# Patient Record
Sex: Male | Born: 1965 | Race: Black or African American | Hispanic: No | Marital: Married | Smoking: Former smoker
Health system: Southern US, Community
[De-identification: ages and names within clinical notes are randomized; demographics above are authoritative.]

## PROBLEM LIST (undated history)

## (undated) DIAGNOSIS — J45909 Unspecified asthma, uncomplicated: Secondary | ICD-10-CM

## (undated) DIAGNOSIS — E119 Type 2 diabetes mellitus without complications: Secondary | ICD-10-CM

## (undated) DIAGNOSIS — D649 Anemia, unspecified: Secondary | ICD-10-CM

## (undated) DIAGNOSIS — G4730 Sleep apnea, unspecified: Secondary | ICD-10-CM

## (undated) DIAGNOSIS — M1990 Unspecified osteoarthritis, unspecified site: Secondary | ICD-10-CM

## (undated) DIAGNOSIS — J449 Chronic obstructive pulmonary disease, unspecified: Secondary | ICD-10-CM

## (undated) DIAGNOSIS — I10 Essential (primary) hypertension: Secondary | ICD-10-CM

## (undated) DIAGNOSIS — E785 Hyperlipidemia, unspecified: Secondary | ICD-10-CM

## (undated) HISTORY — DX: Unspecified osteoarthritis, unspecified site: M19.90

## (undated) HISTORY — DX: Type 2 diabetes mellitus without complications: E11.9

## (undated) HISTORY — DX: Unspecified asthma, uncomplicated: J45.909

## (undated) HISTORY — DX: Chronic obstructive pulmonary disease, unspecified: J44.9

## (undated) HISTORY — DX: Sleep apnea, unspecified: G47.30

## (undated) HISTORY — DX: Essential (primary) hypertension: I10

## (undated) HISTORY — DX: Anemia, unspecified: D64.9

## (undated) HISTORY — DX: Hyperlipidemia, unspecified: E78.5

---

## 2017-09-06 ENCOUNTER — Ambulatory Visit: Payer: BLUE CROSS/BLUE SHIELD | Admitting: *Deleted

## 2017-09-08 ENCOUNTER — Encounter: Payer: BLUE CROSS/BLUE SHIELD | Attending: Endocrinology | Admitting: *Deleted

## 2017-09-08 ENCOUNTER — Encounter: Payer: Self-pay | Admitting: *Deleted

## 2017-09-08 VITALS — BP 142/86 | Ht 68.0 in | Wt 320.1 lb

## 2017-09-08 DIAGNOSIS — Z713 Dietary counseling and surveillance: Secondary | ICD-10-CM | POA: Diagnosis not present

## 2017-09-08 DIAGNOSIS — E119 Type 2 diabetes mellitus without complications: Secondary | ICD-10-CM | POA: Insufficient documentation

## 2017-09-08 NOTE — Patient Instructions (Signed)
Check blood sugars 3 x day before each meal and occasionally 2 hours after one meal Bring blood sugar records to the next class  Exercise: Begin walking for    10-15  minutes  3 days a week  Eat 3 meals day,  2 snacks a day Space meals 4-6 hours apart Limit desserts/sweets  Carry fast acting glucose and a snack at all times Rotate injection sites  Return for classes on:

## 2017-09-09 ENCOUNTER — Encounter: Payer: Self-pay | Admitting: *Deleted

## 2017-09-09 NOTE — Progress Notes (Signed)
Diabetes Self-Management Education  Visit Type: First/Initial  Appt. Start Time: 1545 Appt. End Time: 1700  09/08/2017  [name], identified by name and date of birth, is a 51 y.o. male with a diagnosis of Diabetes: Type 2.   ASSESSMENT  Blood pressure (!) 142/86, height  (1.727 m), weight (!) 320 lb 1.6 oz (145.2 kg). Body mass index is 48.67 kg/m.      Diabetes Self-Management Education - 09/08/17 1702      Visit Information   Visit Type First/Initial     Initial Visit   Diabetes Type Type 2   Are you currently following a meal plan? No   Are you taking your medications as prescribed? Yes   Date Diagnosed 5 years ago     Health Coping   How would you rate your overall health? Good     Psychosocial Assessment   Patient Belief/Attitude about Diabetes Other (comment)  "not sure"   Self-care barriers None   Self-management support Doctor's office;Family   Patient Concerns Nutrition/Meal planning;Glycemic Control;Weight Control;Medication;Monitoring;Healthy Lifestyle;Problem Solving   Special Needs None   Preferred Learning Style Auditory;Visual;Hands on   Learning Readiness Contemplating   How often do you need to have someone help you when you read instructions, pamphlets, or other written materials from your doctor or pharmacy? 1 - Never   What is the last grade level you completed in school? 2 years college     Pre-Education Assessment   Patient understands the diabetes disease and treatment process. Needs Instruction   Patient understands incorporating nutritional management into lifestyle. Needs Instruction   Patient undertands incorporating physical activity into lifestyle. Needs Instruction   Patient understands using medications safely. Needs Instruction   Patient understands monitoring blood glucose, interpreting and using results Needs Review   Patient understands prevention, detection, and treatment of acute complications. Needs Review   Patient  understands prevention, detection, and treatment of chronic complications. Needs Review   Patient understands how to develop strategies to address psychosocial issues. Needs Instruction   Patient understands how to develop strategies to promote health/change behavior. Needs Instruction     Complications   Last HgB A1C per patient/outside source 8.8 %  08/09/17   How often do you check your blood sugar? 3-4 times/day   Fasting Blood glucose range (mg/dL) 16-109  Pt reports FBG's 88-112 mg/dL.    Postprandial Blood glucose range (mg/dL) --  Pt reports pre-lunch readings 120-130's mg/dL and pre-supper 604-540'J mg/dL.    Have you had a dilated eye exam in the past 12 months? Yes   Have you had a dental exam in the past 12 months? Yes   Are you checking your feet? Yes   How many days per week are you checking your feet? 7     Dietary Intake   Breakfast oatmeal or cereal and milk   Snack (morning) granola bar or candy bar   Lunch left overs from supper   Snack (afternoon) chips or granola bar or cancy bar   Dinner meat (fish, beef, pork, chicken) with potatoes, corn, rice, salad, broccoli, cauliflower   Beverage(s) water, diet green tea, occasional Gatorade     Exercise   Exercise Type ADL's     Patient Education   Previous Diabetes Education No   Disease state  Definition of diabetes, type 1 and 2, and the diagnosis of diabetes   Nutrition management  Role of diet in the treatment of diabetes and the relationship between the three main  macronutrients and blood glucose level;Reviewed blood glucose goals for pre and post meals and how to evaluate the patients' food intake on their blood glucose level.   Physical activity and exercise  Role of exercise on diabetes management, blood pressure control and cardiac health.   Medications Taught/reviewed insulin injection, site rotation, insulin storage and needle disposal.;Reviewed patients medication for diabetes, action, purpose, timing of dose  and side effects.   Monitoring Purpose and frequency of SMBG.;Taught/discussed recording of test results and interpretation of SMBG.;Identified appropriate SMBG and/or A1C goals.   Acute complications Taught treatment of hypoglycemia - the 15 rule.   Chronic complications Relationship between chronic complications and blood glucose control   Psychosocial adjustment Identified and addressed patients feelings and concerns about diabetes   Personal strategies to promote health Review risk of smoking and offered smoking cessation     Individualized Goals (developed by patient)   Reducing Risk Improve blood sugars Decrease medications Prevent diabetes complications Lose weight Lead a healthier lifestyle Become more fit Quit smoking     Outcomes   Expected Outcomes Demonstrated interest in learning. Expect positive outcomes      Individualized Plan for Diabetes Self-Management Training:   Learning Objective:  Patient will have a greater understanding of diabetes self-management. Patient education plan is to attend individual and/or group sessions per assessed needs and concerns.   Plan:   Patient Instructions  Check blood sugars 3 x day before each meal and occasionally 2 hours after one meal Bring blood sugar records to the next class Exercise: Begin walking for    10-15  minutes  3 days a week Eat 3 meals day,  2 snacks a day Space meals 4-6 hours apart Limit desserts/sweets Carry fast acting glucose and a snack at all times Rotate injection sites   Expected Outcomes:  Demonstrated interest in learning. Expect positive outcomes  Education material provided:  General Meal Planning Guidelines Simple Meal Plan Symptoms, causes and treatments of Hypoglycemia  If problems or questions, patient to contact team via:  Sharion Settler, RN, CCM, CDE [phone]  Future DSME appointment:  September 12, 2017 for Diabetes Class 1

## 2017-09-12 ENCOUNTER — Encounter: Payer: Self-pay | Admitting: Dietician

## 2017-09-12 ENCOUNTER — Encounter: Payer: BLUE CROSS/BLUE SHIELD | Admitting: Dietician

## 2017-09-12 VITALS — Ht 68.0 in | Wt 317.4 lb

## 2017-09-12 DIAGNOSIS — E119 Type 2 diabetes mellitus without complications: Secondary | ICD-10-CM | POA: Diagnosis not present

## 2017-09-12 NOTE — Progress Notes (Signed)

## 2017-09-19 ENCOUNTER — Encounter: Payer: BLUE CROSS/BLUE SHIELD | Attending: Endocrinology

## 2017-09-19 VITALS — Wt 314.5 lb

## 2017-09-19 DIAGNOSIS — E119 Type 2 diabetes mellitus without complications: Secondary | ICD-10-CM | POA: Diagnosis present

## 2017-09-19 DIAGNOSIS — Z713 Dietary counseling and surveillance: Secondary | ICD-10-CM | POA: Diagnosis not present

## 2017-09-19 NOTE — Progress Notes (Signed)

## 2017-10-03 ENCOUNTER — Encounter: Payer: Self-pay | Admitting: Dietician

## 2017-10-03 ENCOUNTER — Encounter: Payer: BLUE CROSS/BLUE SHIELD | Admitting: Dietician

## 2017-10-03 VITALS — BP 134/78 | Ht 68.0 in | Wt 311.0 lb

## 2017-10-03 DIAGNOSIS — E119 Type 2 diabetes mellitus without complications: Secondary | ICD-10-CM

## 2017-10-03 NOTE — Progress Notes (Signed)

## 2017-10-04 ENCOUNTER — Encounter: Payer: Self-pay | Admitting: *Deleted

## 2018-05-29 ENCOUNTER — Ambulatory Visit: Payer: BLUE CROSS/BLUE SHIELD | Admitting: *Deleted

## 2018-06-08 ENCOUNTER — Encounter: Payer: BLUE CROSS/BLUE SHIELD | Attending: Endocrinology | Admitting: *Deleted

## 2018-06-08 ENCOUNTER — Encounter: Payer: Self-pay | Admitting: *Deleted

## 2018-06-08 VITALS — BP 130/78 | Ht 67.0 in | Wt 317.1 lb

## 2018-06-08 DIAGNOSIS — Z6841 Body Mass Index (BMI) 40.0 and over, adult: Secondary | ICD-10-CM | POA: Diagnosis not present

## 2018-06-08 DIAGNOSIS — Z713 Dietary counseling and surveillance: Secondary | ICD-10-CM | POA: Diagnosis not present

## 2018-06-08 DIAGNOSIS — E119 Type 2 diabetes mellitus without complications: Secondary | ICD-10-CM

## 2018-06-08 NOTE — Patient Instructions (Addendum)
Check blood sugars 4 x day before each meal and before bed every day Bring blood sugar records to the next appointment  Exercise: Begin walking  for  10-15  minutes   3  days a week and gradually increase to 30 minutes 5 x week  Eat 3 meals day, 2-3 snacks a day Space meals 4-6 hours apart Allow 2-3 hours between meals and snacks Limit desserts/sweets  Complete 3 Day Food Record and bring to next appt  Quit  smoking  Carry fast acting glucose and a snack at all times Rotate injection sites  Return for appointment on: Monday July 10, 2018 at 4:00 pm with Jill Sideolleen (dietitian)

## 2018-06-08 NOTE — Progress Notes (Signed)
Diabetes Self-Management Education  Visit Type: First/Initial  Appt. Start Time: 1535 Appt. End Time: 1645  06/08/2018  [name], identified by name and date of birth, is a 52 y.o. male with a diagnosis of Diabetes: Type 2.   ASSESSMENT  Blood pressure 130/78, height 5\' 7"  (1.702 m), weight (!) 317 lb 1.6 oz (143.8 kg). Body mass index is 49.66 kg/m.  Diabetes Self-Management Education - 06/08/18 1737      Visit Information   Visit Type  First/Initial      Initial Visit   Diabetes Type  Type 2    Are you currently following a meal plan?  No    Are you taking your medications as prescribed?  Yes    Date Diagnosed  8 years ago      Health Coping   How would you rate your overall health?  Fair      Psychosocial Assessment   Patient Belief/Attitude about Diabetes  Other (comment) "need to be more cautious"    Self-care barriers  None    Self-management support  Doctor's office;Family    Patient Concerns  Nutrition/Meal planning;Medication;Monitoring;Healthy Lifestyle;Problem Solving;Glycemic Control;Weight Control    Special Needs  None    Preferred Learning Style  Auditory;Visual;Hands on    Learning Readiness  Contemplating    How often do you need to have someone help you when you read instructions, pamphlets, or other written materials from your doctor or pharmacy?  1 - Never    What is the last grade level you completed in school?  2 years college      Pre-Education Assessment   Patient understands the diabetes disease and treatment process.  Needs Review    Patient understands incorporating nutritional management into lifestyle.  Needs Review    Patient undertands incorporating physical activity into lifestyle.  Needs Instruction    Patient understands using medications safely.  Needs Review    Patient understands monitoring blood glucose, interpreting and using results  Needs Review    Patient understands prevention, detection, and treatment of acute  complications.  Needs Review    Patient understands prevention, detection, and treatment of chronic complications.  Needs Review    Patient understands how to develop strategies to address psychosocial issues.  Needs Review    Patient understands how to develop strategies to promote health/change behavior.  Needs Review      Complications   Last HgB A1C per patient/outside source  6.9 % 05/04/18    How often do you check your blood sugar?  3-4 times/day    Fasting Blood glucose range (mg/dL)  [id number] Pt reports FBG's 110-120's mg/dL; pre-lunch 29-5685-92 mg/dL; bedtime 213-086'V150-180's mg/dL    Have you had a dilated eye exam in the past 12 months?  Yes    Have you had a dental exam in the past 12 months?  Yes    Are you checking your feet?  Yes    How many days per week are you checking your feet?  7      Dietary Intake   Breakfast  Greek yogurt and granola bar; peanut butter and jelly sandwich; peanuts or trail mix    Snack (morning)  peanuts, candy bar    Lunch  left overs from supper    Snack (afternoon)  popcorn    Dinner  chicken, beef, pork, fish - potatoes, peas, beans, corn, rice, green beans, broccoli, cauliflower, greens    Snack (evening)  sweets    Beverage(s)  water,  diet green tea      Exercise   Exercise Type  ADL's      Patient Education   Previous Diabetes Education  Yes (please comment) Completed Diabetes classes in 2018    Disease state   Explored patient's options for treatment of their diabetes    Nutrition management   Role of diet in the treatment of diabetes and the relationship between the three main macronutrients and blood glucose level;Food label reading, portion sizes and measuring food.;Reviewed blood glucose goals for pre and post meals and how to evaluate the patients' food intake on their blood glucose level.    Physical activity and exercise   Role of exercise on diabetes management, blood pressure control and cardiac health.    Medications  Taught/reviewed insulin  injection, site rotation, insulin storage and needle disposal.;Reviewed patients medication for diabetes, action, purpose, timing of dose and side effects.    Monitoring  Purpose and frequency of SMBG.;Taught/discussed recording of test results and interpretation of SMBG.;Identified appropriate SMBG and/or A1C goals.    Acute complications  Taught treatment of hypoglycemia - the 15 rule.    Chronic complications  Relationship between chronic complications and blood glucose control    Psychosocial adjustment  Identified and addressed patients feelings and concerns about diabetes      Individualized Goals (developed by patient)   Reducing Risk  Improve blood sugars Decrease medications Prevent diabetes complications Lose weight Lead a healthier lifestyle Become more fit Quit smoking     Outcomes   Expected Outcomes  Demonstrated interest in learning. Expect positive outcomes    Future DMSE  4-6 wks       Individualized Plan for Diabetes Self-Management Training:   Learning Objective:  Patient will have a greater understanding of diabetes self-management. Patient education plan is to attend individual and/or group sessions per assessed needs and concerns.   Plan:   Patient Instructions  Check blood sugars 4 x day before each meal and before bed every day Bring blood sugar records to the next appointment Exercise: Begin walking  for  10-15  minutes   3  days a week and gradually increase to 30 minutes 5 x week Eat 3 meals day, 2-3 snacks a day Space meals 4-6 hours apart Allow 2-3 hours between meals and snacks Limit desserts/sweets Complete 3 Day Food Record and bring to next appt Quit  smoking Carry fast acting glucose and a snack at all times Rotate injection sites Return for appointment on: Monday July 10, 2018 at 4:00 pm with Jill Side (dietitian)  Expected Outcomes:  Demonstrated interest in learning. Expect positive outcomes  Education material provided:  General Meal  Planning Guidelines Simple Meal Plan 3 Day Food Record Glucose tablets Symptoms, causes and treatments of Hypoglycemia  If problems or questions, patient to contact team via:  Logan Settler, RN, CCM, CDE [phone]  Future DSME appointment: 4-6 wks  July 10, 2018 with the dietitian

## 2018-07-10 ENCOUNTER — Encounter: Payer: Self-pay | Admitting: Dietician

## 2018-07-10 ENCOUNTER — Encounter: Payer: BLUE CROSS/BLUE SHIELD | Attending: Endocrinology | Admitting: Dietician

## 2018-07-10 VITALS — Wt 317.7 lb

## 2018-07-10 DIAGNOSIS — Z713 Dietary counseling and surveillance: Secondary | ICD-10-CM | POA: Insufficient documentation

## 2018-07-10 DIAGNOSIS — Z6841 Body Mass Index (BMI) 40.0 and over, adult: Secondary | ICD-10-CM | POA: Diagnosis not present

## 2018-07-10 DIAGNOSIS — E119 Type 2 diabetes mellitus without complications: Secondary | ICD-10-CM

## 2018-07-10 NOTE — Progress Notes (Signed)
Diabetes Self-Management Education  Visit Type:  Follow-up  Appt. Start Time: 1600 Appt. End Time: 1700  07/10/2018  [name], identified by name and date of birth, is a 52 y.o. male with a diagnosis of Diabetes:Type 2 DM  ASSESSMENT  Weight (!) 317 lb 11.2 oz (144.1 kg). Body mass index is 49.76 kg/m.   Diabetes Self-Management Education - 85/63/14 9702      Complications   Last HgB A1C per patient/outside source  6.5 %    How often do you check your blood sugar?  3-4 times/day    Fasting Blood glucose range (mg/dL)  70-129;130-179    Have you had a dilated eye exam in the past 12 months?  Yes    Have you had a dental exam in the past 12 months?  Yes    Are you checking your feet?  Yes    How many days per week are you checking your feet?  7      Dietary Intake   Breakfast  7-8:00am- 2 eggs with cheese, 2 slices white toast, diet green tea or sometimes 2 candy bars out of vending machine    Snack (morning)  diet mountain dew, Kit Kat candy bar    Lunch  6"in tuna sub, 1 oatmeal raisin cookie, sweet tea or 3 pieces fried chicken, fries, large sweet tea ([employer]) or leftovers    Snack (afternoon)  1 large bag of popcorn    Dinner  taco salad, diet green tea or chicken pie, collard greens, baked beans, diet green tea    Beverage(s)  diet green tea, water, sweet tea ( 3 x per week)      Exercise   Exercise Type  Light (walking / raking leaves) stretches; weights 2    How many days per week to you exercise?  2    How many minutes per day do you exercise?  30    Total minutes per week of exercise  60      Patient Education   Nutrition management   Role of diet in the treatment of diabetes and the relationship between the three main macronutrients and blood glucose level;Carbohydrate counting;Information on hints to eating out and maintain blood glucose control.;Food label reading, portion sizes and measuring food.;Effects of alcohol on blood glucose and safety factors  with consumption of alcohol.;Meal options for control of blood glucose level and chronic complications.    Physical activity and exercise   Role of exercise on diabetes management, blood pressure control and cardiac health.;Helped patient identify appropriate exercises in relation to his/her diabetes, diabetes complications and other health issue.      Post-Education Assessment   Patient understands the diabetes disease and treatment process.  Demonstrates understanding / competency    Patient understands incorporating nutritional management into lifestyle.  Demonstrates understanding / competency    Patient undertands incorporating physical activity into lifestyle.  Demonstrates understanding / competency    Patient understands using medications safely.  Demonstrates understanding / competency    Patient understands monitoring blood glucose, interpreting and using results  Demonstrates understanding / competency    Patient understands prevention, detection, and treatment of acute complications.  Demonstrates understanding / competency    Patient understands how to develop strategies to promote health/change behavior.  Demonstrates understanding / competency      Outcomes   Program Status  Completed       Learning Objective:  Patient will have a greater understanding of diabetes self-management. Patient education plan  is to attend individual and/or group sessions per assessed needs and concerns.  Education: Instructed patient on a meal plan based on 1800-2000 calories including carbohydrate counting, portion control and how to better balance carbohydrate, protein and non-starchy vegetables. Use food guide plate, planning a balanced meal hand-out and food models to instruct. Instructed on reading labels for carbohydrate content. Reviewed his food records with him making suggestions on ways to adjust as well as commending on positive changes made.      Patient Instructions  Balance meals  with 2-4 oz lean protein, 3-4 servings of carbohydrate (starch, fruit, milk/yogurt) and free vegetables. Switch to all unsweetened beverages or begin with asking for half sweetened tea, half unsweetened and continue with increasing water intake. Include as many non-starchy vegetables as possible. Read labels for carbohydrate gms. Continue to work with portion control.  Limit dinner to 1 plate with exception of eating any non-starchy vegetables. Try graham crackers and peanut butter to help when wanting something sweet rather than candy bars. Begin to work out at gym at apartment complex.    Expected Outcomes:  Demonstrated interest in learning. Expect positive outcomes  Education material provided:  Food guide plate Planning a Balanced Meal Balanced meal menus  If problems or questions, patient to contact team via: Karolee Stamps, [place], [place]  Future DSME appointment: - PRN

## 2018-07-10 NOTE — Patient Instructions (Addendum)
Balance meals with 2-4 oz lean protein, 3-4 servings of carbohydrate (starch, fruit, milk/yogurt) and free vegetables. Switch to all unsweetened beverages or begin with asking for half sweetened tea, half unsweetened and continue with increasing water intake. Include as many non-starchy vegetables as possible. Read labels for carbohydrate gms. Continue to work with portion control.  Limit dinner to 1 plate with exception of eating any non-starchy vegetables. Try graham crackers and peanut butter to help when wanting something sweet rather than candy bars. Begin to work out at gym at apartment complex.

## 2019-05-18 ENCOUNTER — Ambulatory Visit: Payer: Self-pay | Admitting: Urology

## 2019-05-22 ENCOUNTER — Encounter: Payer: Self-pay | Admitting: Urology

## 2019-05-22 ENCOUNTER — Ambulatory Visit: Payer: BC Managed Care – PPO | Admitting: Urology

## 2019-05-22 ENCOUNTER — Other Ambulatory Visit: Payer: Self-pay

## 2019-05-22 VITALS — BP 158/79 | HR 101 | Ht 68.0 in | Wt 302.6 lb

## 2019-05-22 DIAGNOSIS — E291 Testicular hypofunction: Secondary | ICD-10-CM | POA: Diagnosis not present

## 2019-05-22 DIAGNOSIS — B356 Tinea cruris: Secondary | ICD-10-CM | POA: Diagnosis not present

## 2019-05-22 NOTE — Progress Notes (Signed)
05/22/2019 2:23 PM   Logan Harrell 1966-07-19 [id number]  Referring provider: Alan Mulder, MD [address] [place], [place] [address]  Chief Complaint  Patient presents with  . Hypogonadism    HPI: 53 year old male seen in consultation at request of Dr. Patrecia Pace for evaluation of hypogonadism.  He states he was started on TRT approximately 1 year ago when his testosterone levels were low.  He states he was not having symptoms of low testosterone however when questioned further does complain of tiredness, fatigue and decreased libido.  He also has erectile dysfunction and is on sildenafil.  He was started on testosterone IM injections 300 mg every 3 weeks.  A testosterone level performed on 03/10/2019 was <100 and he was recommended he stop injections and was referred here.  The timing of the blood draw in relation to his last injection is unknown.  On review of available labs I did not see an LH or prolactin.  He has been off replacement approximately 2 months.  He is also complaining of a rash in the groin area bilaterally and has only used Neosporin.  PSA was 0.5 on 03/10/2019  PMH: Past Medical History:  Diagnosis Date  . Arthritis   . Diabetes mellitus without complication (HCC)   . Hyperlipidemia   . Hypertension   . Sleep apnea     Surgical History: No past surgical history on file.  Home Medications:  Allergies as of 05/22/2019   No Known Allergies     Medication List       Accurate as of May 22, 2019  2:23 PM. If you have any questions, ask your nurse or doctor.        STOP taking these medications   GINSENG PO Stopped by:  Riki Altes, MD   KRILL OIL PO Stopped by:  Riki Altes, MD     TAKE these medications   aspirin EC 81 MG tablet Take 81 mg by mouth daily.   Contour Next Test test strip Generic drug:  glucose blood   Jardiance 25 MG Tabs tablet Generic drug:  empagliflozin Take 25 mg by mouth daily.   Kombiglyze XR 2.04-999  MG Tb24 Generic drug:  Saxagliptin-Metformin Take 1 tablet by mouth 2 (two) times daily.   lisinopril 20 MG tablet Commonly known as:  ZESTRIL Take 20 mg by mouth daily.   Microlet Lancets Misc   multivitamin tablet Take 1 tablet by mouth daily.   Ozempic (0.25 or 0.5 MG/DOSE) 2 MG/1.5ML Sopn Generic drug:  Semaglutide(0.25 or 0.5MG /DOS) Inject 1 mg into the skin once a week.   rosuvastatin 10 MG tablet Commonly known as:  CRESTOR Take 10 mg by mouth daily.   sildenafil 50 MG tablet Commonly known as:  VIAGRA   Toujeo SoloStar 300 UNIT/ML Sopn Generic drug:  Insulin Glargine (1 Unit Dial) What changed:  Another medication with the same name was removed. Continue taking this medication, and follow the directions you see here. Changed by:  Riki Altes, MD   vitamin C 500 MG tablet Commonly known as:  ASCORBIC ACID Take 500 mg by mouth daily.   VITAMIN D (CHOLECALCIFEROL) PO Take 1-2 capsules by mouth daily.       Allergies: No Known Allergies  Family History: Family History  Problem Relation Age of Onset  . Diabetes Mother   . Diabetes Father     Social History:  reports that he has been smoking cigarettes. He has a 35.00 pack-year smoking history.  He has never used smokeless tobacco. He reports current alcohol use of about 4.0 standard drinks of alcohol per week. He reports that he does not use drugs.  ROS: UROLOGY Frequent Urination?: Yes Hard to postpone urination?: No Burning/pain with urination?: No Get up at night to urinate?: Yes Leakage of urine?: Yes Urine stream starts and stops?: No Trouble starting stream?: No Do you have to strain to urinate?: No Blood in urine?: No Urinary tract infection?: Yes Sexually transmitted disease?: No Injury to kidneys or bladder?: No Painful intercourse?: No Weak stream?: No Erection problems?: Yes Penile pain?: No  Gastrointestinal Nausea?: No Vomiting?: No Indigestion/heartburn?: No Diarrhea?: No  Constipation?: No  Constitutional Fever: No Night sweats?: Yes Weight loss?: No Fatigue?: No  Skin Skin rash/lesions?: Yes Itching?: Yes  Eyes Blurred vision?: No Double vision?: No  Ears/Nose/Throat Sore throat?: No Sinus problems?: No  Hematologic/Lymphatic Swollen glands?: No Easy bruising?: No  Cardiovascular Leg swelling?: No Chest pain?: No  Respiratory Cough?: No Shortness of breath?: No  Endocrine Excessive thirst?: No  Musculoskeletal Back pain?: Yes Joint pain?: Yes  Neurological Headaches?: No Dizziness?: No  Psychologic Depression?: No Anxiety?: No  Physical Exam: BP (!) 158/79 (BP Location: Left Arm, Patient Position: Sitting, Cuff Size: Normal)   Pulse (!) 101   Ht 5\' 8"  (1.727 m)   Wt (!) 302 lb 9.6 oz (137.3 kg)   BMI 46.01 kg/m   Constitutional:  Alert and oriented, No acute distress. HEENT: [place] AT, moist mucus membranes.  Trachea midline, no masses. Cardiovascular: No clubbing, cyanosis, or edema. Respiratory: Normal respiratory effort, no increased work of breathing. GI: Abdomen is soft, nontender, nondistended, no abdominal masses GU: No CVA tenderness.  Penis without lesions, testes descended bilateral without masses or tenderness, cord/epididymis palpably normal bilaterally.  Mild erythema groin creases bilaterally consistent with tinea cruris Lymph: No cervical or inguinal lymphadenopathy. Skin: No rashes, bruises or suspicious lesions. Neurologic: Grossly intact, no focal deficits, moving all 4 extremities. Psychiatric: Normal mood and affect.   Assessment & Plan:   53 year old male with hypogonadism with symptoms of tiredness, fatigue and decreased libido.  He has been off TRT for least 2 months. Repeat T level drawn today as well as LH and prolactin.  Replacement options were reviewed including topicals, resuming IM injections either weekly or every 2 weeks and Xyosted.  He was interested in [place]osted if his insurance will  cover.  Potential side effects of testosterone replacement were discussed including stimulation of benign prostatic growth with lower urinary tract symptoms; erythrocytosis; edema; gynecomastia; worsening sleep apnea; venous thromboembolism; testicular atrophy and infertility. Recent studies suggesting an increased incidence of heart attack and stroke in patients taking testosterone was discussed. He was informed there is conflicting evidence regarding the impact of testosterone therapy on cardiovascular risk. The theoretical risk of growth stimulation of an undetected prostate cancer was also discussed.  He was informed that current evidence does not provide any definitive answers regarding the risks of testosterone therapy on prostate cancer and cardiovascular disease. The need for periodic monitoring of his testosterone level, PSA, hematocrit and DRE was discussed.  Tolnaftate recommended for his tinea cruris.   Riki AltesScott C Stoioff, MD  [facility] [address], [address] [place], [place] [id number] [phone]

## 2019-05-22 NOTE — Patient Instructions (Signed)
Erectile Dysfunction  Erectile dysfunction (ED) is the inability to get or keep an erection in order to have sexual intercourse. Erectile dysfunction may include:   Inability to get an erection.   Lack of enough hardness of the erection to allow penetration.   Loss of the erection before sex is finished.  What are the causes?  This condition may be caused by:   Certain medicines, such as:  ? Pain relievers.  ? Antihistamines.  ? Antidepressants.  ? Blood pressure medicines.  ? Water pills (diuretics).  ? Ulcer medicines.  ? Muscle relaxants.  ? Drugs.   Excessive drinking.   Psychological causes, such as:  ? Anxiety.  ? Depression.  ? Sadness.  ? Exhaustion.  ? Performance fear.  ? Stress.   Physical causes, such as:  ? Artery problems. This may include diabetes, smoking, liver disease, or atherosclerosis.  ? High blood pressure.  ? Hormonal problems, such as low testosterone.  ? Obesity.  ? Nerve problems. This may include back or pelvic injuries, diabetes mellitus, multiple sclerosis, or Parkinson disease.  What are the signs or symptoms?  Symptoms of this condition include:   Inability to get an erection.   Lack of enough hardness of the erection to allow penetration.   Loss of the erection before sex is finished.   Normal erections at some times, but with frequent unsatisfactory episodes.   Low sexual satisfaction in either partner due to erection problems.   A curved penis occurring with erection. The curve may cause pain or the penis may be too curved to allow for intercourse.   Never having nighttime erections.  How is this diagnosed?  This condition is often diagnosed by:   Performing a physical exam to find other diseases or specific problems with the penis.   Asking you detailed questions about the problem.   Performing blood tests to check for diabetes mellitus or to measure hormone levels.   Performing other tests to check for underlying health conditions.   Performing an ultrasound  exam to check for scarring.   Performing a test to check blood flow to the penis.   Doing a sleep study at home to measure nighttime erections.  How is this treated?  This condition may be treated by:   Medicine taken by mouth to help you achieve an erection (oral medicine).   Hormone replacement therapy to replace low testosterone levels.   Medicine that is injected into the penis. Your health care provider may instruct you how to give yourself these injections at home.   Vacuum pump. This is a pump with a ring on it. The pump and ring are placed on the penis and used to create pressure that helps the penis become erect.   Penile implant surgery. In this procedure, you may receive:  ? An inflatable implant. This consists of cylinders, a pump, and a reservoir. The cylinders can be inflated with a fluid that helps to create an erection, and they can be deflated after intercourse.  ? A semi-rigid implant. This consists of two silicone rubber rods. The rods provide some rigidity. They are also flexible, so the penis can both curve downward in its normal position and become straight for sexual intercourse.   Blood vessel surgery, to improve blood flow to the penis. During this procedure, a blood vessel from a different part of the body is placed into the penis to allow blood to flow around (bypass) damaged or blocked blood vessels.     Lifestyle changes, such as exercising more, losing weight, and quitting smoking.  Follow these instructions at home:  Medicines     Take over-the-counter and prescription medicines only as told by your health care provider. Do not increase the dosage without first discussing it with your health care provider.   If you are using self-injections, perform injections as directed by your health care provider. Make sure to avoid any veins that are on the surface of the penis. After giving an injection, apply pressure to the injection site for 5 minutes.  General  instructions   Exercise regularly, as directed by your health care provider. Work with your health care provider to lose weight, if needed.   Do not use any products that contain nicotine or tobacco, such as cigarettes and e-cigarettes. If you need help quitting, ask your health care provider.   Before using a vacuum pump, read the instructions that come with the pump and discuss any questions with your health care provider.   Keep all follow-up visits as told by your health care provider. This is important.  Contact a health care provider if:   You feel nauseous.   You vomit.  Get help right away if:   You are taking oral or injectable medicines and you have an erection that lasts longer than 4 hours. If your health care provider is unavailable, go to the nearest emergency room for evaluation. An erection that lasts much longer than 4 hours can result in permanent damage to your penis.   You have severe pain in your groin or abdomen.   You develop redness or severe swelling of your penis.   You have redness spreading up into your groin or lower abdomen.   You are unable to urinate.   You experience chest pain or a rapid heart beat (palpitations) after taking oral medicines.  Summary   Erectile dysfunction (ED) is the inability to get or keep an erection during sexual intercourse. This problem can usually be treated successfully.   This condition is diagnosed based on a physical exam, your symptoms, and tests to determine the cause. Treatment varies depending on the cause, and may include medicines, hormone therapy, surgery, or vacuum pump.   You may need follow-up visits to make sure that you are using your medicines or devices correctly.   Get help right away if you are taking or injecting medicines and you have an erection that lasts longer than 4 hours.  This information is not intended to replace advice given to you by your health care provider. Make sure you discuss any questions you have with  your health care provider.  Document Released: 12/03/2000 Document Revised: 12/22/2016 Document Reviewed: 12/22/2016  Elsevier Interactive Patient Education  2019 [employer]

## 2019-05-23 ENCOUNTER — Telehealth: Payer: Self-pay

## 2019-05-23 DIAGNOSIS — E291 Testicular hypofunction: Secondary | ICD-10-CM

## 2019-05-23 LAB — PROLACTIN: Prolactin: 7.2 ng/mL (ref 4.0–15.2)

## 2019-05-23 LAB — TESTOSTERONE: Testosterone: 144 ng/dL — ABNORMAL LOW (ref 264–916)

## 2019-05-23 LAB — LUTEINIZING HORMONE: LH: 16.7 m[IU]/mL — ABNORMAL HIGH (ref 1.7–8.6)

## 2019-05-23 NOTE — Telephone Encounter (Signed)
Patient notified he is checking with his insurance company to see which medication will be covered and will call back to notify us which one to send in

## 2019-05-23 NOTE — Telephone Encounter (Signed)
-----   Message from Riki Altes, MD sent at 05/23/2019 12:30 PM EDT ----- Testosterone level low at 144.  LH and prolactin showed no evidence of the pituitary abnormalities.  Okay to restart TRT.  I discussed topicals, resuming intramuscular injections and subcutaneous testosterone (Xyosted).  He was going to think over these options.  Does he have a preference?

## 2019-05-23 NOTE — Telephone Encounter (Signed)
Patient called the office back and stated that his insurance will cover the Self injection generic brand testosterone.

## 2019-05-24 ENCOUNTER — Encounter: Payer: Self-pay | Admitting: Urology

## 2019-05-24 MED ORDER — TESTOSTERONE ENANTHATE 75 MG/0.5ML ~~LOC~~ SOAJ: 75.0000 ug | SUBCUTANEOUS | 2 refills | Status: DC

## 2019-05-24 NOTE — Addendum Note (Signed)
Addended by: Riki Altes on: 05/24/2019 07:26 AM   Modules accepted: Orders

## 2019-05-24 NOTE — Telephone Encounter (Signed)
LMOM to notify patient RX was sent to pharmacy and they will get PA and send Medication to home.

## 2019-05-24 NOTE — Telephone Encounter (Addendum)
Rx [employer] sent to [facility] pharmacy  Please schedule follow-up appointment 6-8 weeks with testosterone level prior

## 2020-11-19 ENCOUNTER — Emergency Department: Payer: BC Managed Care – PPO

## 2020-11-19 ENCOUNTER — Emergency Department
Admission: EM | Admit: 2020-11-19 | Discharge: 2020-11-19 | Disposition: A | Discharge status: Home / Self Care | Payer: BC Managed Care – PPO | Attending: Emergency Medicine | Admitting: Emergency Medicine

## 2020-11-19 ENCOUNTER — Encounter: Payer: Self-pay | Admitting: *Deleted

## 2020-11-19 ENCOUNTER — Other Ambulatory Visit: Payer: Self-pay

## 2020-11-19 DIAGNOSIS — M79652 Pain in left thigh: Secondary | ICD-10-CM | POA: Insufficient documentation

## 2020-11-19 DIAGNOSIS — M545 Low back pain, unspecified: Secondary | ICD-10-CM | POA: Insufficient documentation

## 2020-11-19 DIAGNOSIS — E119 Type 2 diabetes mellitus without complications: Secondary | ICD-10-CM | POA: Insufficient documentation

## 2020-11-19 DIAGNOSIS — Z79899 Other long term (current) drug therapy: Secondary | ICD-10-CM | POA: Insufficient documentation

## 2020-11-19 DIAGNOSIS — F17210 Nicotine dependence, cigarettes, uncomplicated: Secondary | ICD-10-CM | POA: Insufficient documentation

## 2020-11-19 DIAGNOSIS — I10 Essential (primary) hypertension: Secondary | ICD-10-CM | POA: Diagnosis not present

## 2020-11-19 DIAGNOSIS — M542 Cervicalgia: Secondary | ICD-10-CM | POA: Insufficient documentation

## 2020-11-19 DIAGNOSIS — Z7982 Long term (current) use of aspirin: Secondary | ICD-10-CM | POA: Diagnosis not present

## 2020-11-19 DIAGNOSIS — Y92410 Unspecified street and highway as the place of occurrence of the external cause: Secondary | ICD-10-CM | POA: Diagnosis not present

## 2020-11-19 MED ORDER — METHOCARBAMOL 750 MG PO TABS: 750.0000 mg | ORAL_TABLET | Freq: Four times a day (QID) | ORAL | 0 refills | Status: AC | PRN

## 2020-11-19 MED ORDER — MELOXICAM 15 MG PO TABS: 15.0000 mg | ORAL_TABLET | Freq: Every day | ORAL | 0 refills | Status: AC

## 2020-11-19 MED ORDER — METHOCARBAMOL 500 MG PO TABS
750.0000 mg | ORAL_TABLET | Freq: Once | ORAL | Status: AC
  Administered 2020-11-19: 750 mg via ORAL
  Filled 2020-11-19: qty 2

## 2020-11-19 MED ORDER — KETOROLAC TROMETHAMINE 60 MG/2ML IM SOLN
30.0000 mg | Freq: Once | INTRAMUSCULAR | Status: AC
  Administered 2020-11-19: 30 mg via INTRAMUSCULAR
  Filled 2020-11-19: qty 2
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
## 2023-08-08 ENCOUNTER — Other Ambulatory Visit: Payer: Self-pay

## 2023-08-08 ENCOUNTER — Emergency Department: Payer: BC Managed Care – PPO

## 2023-08-08 ENCOUNTER — Inpatient Hospital Stay
Admission: EM | Admit: 2023-08-08 | Discharge: 2023-08-15 | DRG: 643 | Disposition: A | Discharge status: Home / Self Care | Payer: BC Managed Care – PPO | Attending: Internal Medicine | Admitting: Internal Medicine

## 2023-08-08 DIAGNOSIS — R7989 Other specified abnormal findings of blood chemistry: Secondary | ICD-10-CM | POA: Diagnosis present

## 2023-08-08 DIAGNOSIS — Z794 Long term (current) use of insulin: Secondary | ICD-10-CM

## 2023-08-08 DIAGNOSIS — R059 Cough, unspecified: Secondary | ICD-10-CM | POA: Diagnosis not present

## 2023-08-08 DIAGNOSIS — I10 Essential (primary) hypertension: Secondary | ICD-10-CM | POA: Insufficient documentation

## 2023-08-08 DIAGNOSIS — K8020 Calculus of gallbladder without cholecystitis without obstruction: Secondary | ICD-10-CM | POA: Diagnosis present

## 2023-08-08 DIAGNOSIS — I2489 Other forms of acute ischemic heart disease: Secondary | ICD-10-CM | POA: Diagnosis present

## 2023-08-08 DIAGNOSIS — G4733 Obstructive sleep apnea (adult) (pediatric): Secondary | ICD-10-CM | POA: Diagnosis present

## 2023-08-08 DIAGNOSIS — E2609 Other primary hyperaldosteronism: Secondary | ICD-10-CM | POA: Insufficient documentation

## 2023-08-08 DIAGNOSIS — I509 Heart failure, unspecified: Secondary | ICD-10-CM | POA: Diagnosis present

## 2023-08-08 DIAGNOSIS — Z789 Other specified health status: Secondary | ICD-10-CM

## 2023-08-08 DIAGNOSIS — I110 Hypertensive heart disease with heart failure: Secondary | ICD-10-CM | POA: Diagnosis present

## 2023-08-08 DIAGNOSIS — E785 Hyperlipidemia, unspecified: Secondary | ICD-10-CM | POA: Diagnosis present

## 2023-08-08 DIAGNOSIS — Z7982 Long term (current) use of aspirin: Secondary | ICD-10-CM

## 2023-08-08 DIAGNOSIS — Z79899 Other long term (current) drug therapy: Secondary | ICD-10-CM

## 2023-08-08 DIAGNOSIS — F17210 Nicotine dependence, cigarettes, uncomplicated: Secondary | ICD-10-CM | POA: Diagnosis present

## 2023-08-08 DIAGNOSIS — Z833 Family history of diabetes mellitus: Secondary | ICD-10-CM

## 2023-08-08 DIAGNOSIS — E2601 Conn's syndrome: Principal | ICD-10-CM | POA: Diagnosis present

## 2023-08-08 DIAGNOSIS — E1165 Type 2 diabetes mellitus with hyperglycemia: Secondary | ICD-10-CM | POA: Diagnosis present

## 2023-08-08 DIAGNOSIS — I1A0 Resistant hypertension: Secondary | ICD-10-CM | POA: Diagnosis present

## 2023-08-08 DIAGNOSIS — E669 Obesity, unspecified: Secondary | ICD-10-CM | POA: Insufficient documentation

## 2023-08-08 DIAGNOSIS — Z7985 Long-term (current) use of injectable non-insulin antidiabetic drugs: Secondary | ICD-10-CM

## 2023-08-08 DIAGNOSIS — A4189 Other specified sepsis: Secondary | ICD-10-CM

## 2023-08-08 DIAGNOSIS — R718 Other abnormality of red blood cells: Secondary | ICD-10-CM | POA: Diagnosis present

## 2023-08-08 DIAGNOSIS — M7989 Other specified soft tissue disorders: Secondary | ICD-10-CM | POA: Diagnosis present

## 2023-08-08 DIAGNOSIS — Z8639 Personal history of other endocrine, nutritional and metabolic disease: Secondary | ICD-10-CM

## 2023-08-08 DIAGNOSIS — Z7984 Long term (current) use of oral hypoglycemic drugs: Secondary | ICD-10-CM

## 2023-08-08 DIAGNOSIS — K760 Fatty (change of) liver, not elsewhere classified: Secondary | ICD-10-CM | POA: Diagnosis present

## 2023-08-08 DIAGNOSIS — E876 Hypokalemia: Secondary | ICD-10-CM | POA: Diagnosis present

## 2023-08-08 DIAGNOSIS — Z8679 Personal history of other diseases of the circulatory system: Secondary | ICD-10-CM

## 2023-08-08 DIAGNOSIS — Z23 Encounter for immunization: Secondary | ICD-10-CM

## 2023-08-08 DIAGNOSIS — R600 Localized edema: Secondary | ICD-10-CM | POA: Diagnosis not present

## 2023-08-08 DIAGNOSIS — I160 Hypertensive urgency: Secondary | ICD-10-CM | POA: Diagnosis present

## 2023-08-08 DIAGNOSIS — Z79890 Hormone replacement therapy (postmenopausal): Secondary | ICD-10-CM

## 2023-08-08 DIAGNOSIS — Z6841 Body Mass Index (BMI) 40.0 and over, adult: Secondary | ICD-10-CM

## 2023-08-08 DIAGNOSIS — U071 COVID-19: Secondary | ICD-10-CM

## 2023-08-08 LAB — ACETAMINOPHEN LEVEL: Acetaminophen (Tylenol), Serum: <10 ug/mL — ABNORMAL LOW (ref 10–30)

## 2023-08-08 LAB — CBC
HCT: 37.3 % — ABNORMAL LOW (ref 39.0–52.0)
Hemoglobin: 12.3 g/dL — ABNORMAL LOW (ref 13.0–17.0)
MCH: 23.2 pg — ABNORMAL LOW (ref 26.0–34.0)
MCHC: 33 g/dL (ref 30.0–36.0)
MCV: 70.2 fL — ABNORMAL LOW (ref 80.0–100.0)
Platelets: 261 10*3/uL (ref 150–400)
RBC: 5.31 MIL/uL (ref 4.22–5.81)
RDW: 14.7 % (ref 11.5–15.5)
WBC: 6.5 10*3/uL (ref 4.0–10.5)
nRBC: 0 % (ref 0.0–0.2)

## 2023-08-08 LAB — HEPATIC FUNCTION PANEL
ALT: 158 U/L — ABNORMAL HIGH (ref 0–44)
AST: 216 U/L — ABNORMAL HIGH (ref 15–41)
Albumin: 3.6 g/dL (ref 3.5–5.0)
Alkaline Phosphatase: 50 U/L (ref 38–126)
Bilirubin, Direct: 0.3 mg/dL — ABNORMAL HIGH (ref 0.0–0.2)
Indirect Bilirubin: 2.5 mg/dL — ABNORMAL HIGH (ref 0.3–0.9)
Total Bilirubin: 2.8 mg/dL — ABNORMAL HIGH (ref 0.3–1.2)
Total Protein: 6.4 g/dL — ABNORMAL LOW (ref 6.5–8.1)

## 2023-08-08 LAB — BASIC METABOLIC PANEL
Anion gap: 10 (ref 5–15)
BUN: 6 mg/dL (ref 6–20)
CO2: 31 mmol/L (ref 22–32)
Calcium: 7.9 mg/dL — ABNORMAL LOW (ref 8.9–10.3)
Chloride: 97 mmol/L — ABNORMAL LOW (ref 98–111)
Creatinine, Ser: 0.86 mg/dL (ref 0.61–1.24)
GFR, Estimated: >60 mL/min (ref >60)
Glucose, Bld: 110 mg/dL — ABNORMAL HIGH (ref 70–99)
Potassium: <2 mmol/L — CL (ref 3.5–5.1)
Sodium: 138 mmol/L (ref 135–145)

## 2023-08-08 LAB — GLUCOSE, CAPILLARY: Glucose-Capillary: 113 mg/dL — ABNORMAL HIGH (ref 70–99)

## 2023-08-08 LAB — TYPE AND SCREEN
ABO/RH(D): B POS
Antibody Screen: NEGATIVE

## 2023-08-08 LAB — MAGNESIUM: Magnesium: 2 mg/dL (ref 1.7–2.4)

## 2023-08-08 LAB — BRAIN NATRIURETIC PEPTIDE: B Natriuretic Peptide: 477.1 pg/mL — ABNORMAL HIGH (ref 0.0–100.0)

## 2023-08-08 LAB — ETHANOL: Alcohol, Ethyl (B): <10 mg/dL (ref <10)

## 2023-08-08 LAB — POTASSIUM: Potassium: 2.1 mmol/L — CL (ref 3.5–5.1)

## 2023-08-08 LAB — TROPONIN I (HIGH SENSITIVITY): Troponin I (High Sensitivity): 82 ng/L — ABNORMAL HIGH (ref <18)

## 2023-08-08 LAB — LIPASE, BLOOD: Lipase: 30 U/L (ref 11–51)

## 2023-08-08 MED ORDER — POTASSIUM CHLORIDE CRYS ER 20 MEQ PO TBCR
40.0000 meq | EXTENDED_RELEASE_TABLET | Freq: Once | ORAL | Status: AC
  Administered 2023-08-08: 40 meq via ORAL
  Filled 2023-08-08: qty 2

## 2023-08-08 MED ORDER — PANTOPRAZOLE SODIUM 40 MG IV SOLR
40.0000 mg | Freq: Two times a day (BID) | INTRAVENOUS | Status: DC
  Administered 2023-08-08 – 2023-08-15 (×14): 40 mg via INTRAVENOUS
  Filled 2023-08-08 (×14): qty 10

## 2023-08-08 MED ORDER — MIFEPRISTONE 300 MG PO TABS: 600.0000 mg | ORAL_TABLET | Freq: Every day | ORAL | Status: DC

## 2023-08-08 MED ORDER — ACETAMINOPHEN 650 MG RE SUPP: 650.0000 mg | Freq: Four times a day (QID) | RECTAL | Status: DC | PRN

## 2023-08-08 MED ORDER — ONDANSETRON HCL 4 MG PO TABS: 4.0000 mg | ORAL_TABLET | Freq: Four times a day (QID) | ORAL | Status: DC | PRN

## 2023-08-08 MED ORDER — MORPHINE SULFATE (PF) 2 MG/ML IV SOLN: 2.0000 mg | INTRAVENOUS | Status: DC | PRN

## 2023-08-08 MED ORDER — HEPARIN SODIUM (PORCINE) 5000 UNIT/ML IJ SOLN
5000.0000 [IU] | Freq: Three times a day (TID) | INTRAMUSCULAR | Status: DC
  Administered 2023-08-08 – 2023-08-09 (×2): 5000 [IU] via SUBCUTANEOUS
  Filled 2023-08-08 (×2): qty 1

## 2023-08-08 MED ORDER — LISINOPRIL 20 MG PO TABS
20.0000 mg | ORAL_TABLET | Freq: Every day | ORAL | Status: DC
  Administered 2023-08-08 – 2023-08-10 (×3): 20 mg via ORAL
  Filled 2023-08-08 (×3): qty 1

## 2023-08-08 MED ORDER — ONDANSETRON HCL 4 MG/2ML IJ SOLN: 4.0000 mg | Freq: Four times a day (QID) | INTRAMUSCULAR | Status: DC | PRN

## 2023-08-08 MED ORDER — HYDRALAZINE HCL 20 MG/ML IJ SOLN
5.0000 mg | INTRAMUSCULAR | Status: DC | PRN
  Administered 2023-08-08 – 2023-08-11 (×6): 5 mg via INTRAVENOUS
  Filled 2023-08-08 (×7): qty 1

## 2023-08-08 MED ORDER — ASPIRIN 81 MG PO TBEC
81.0000 mg | DELAYED_RELEASE_TABLET | Freq: Every day | ORAL | Status: DC
  Administered 2023-08-09 – 2023-08-15 (×7): 81 mg via ORAL
  Filled 2023-08-08 (×7): qty 1

## 2023-08-08 MED ORDER — MIFEPRISTONE 300 MG PO TABS: 300.0000 mg | ORAL_TABLET | Freq: Every day | ORAL | Status: DC

## 2023-08-08 MED ORDER — SODIUM CHLORIDE 0.9% FLUSH
3.0000 mL | Freq: Two times a day (BID) | INTRAVENOUS | Status: DC
  Administered 2023-08-08 – 2023-08-15 (×13): 3 mL via INTRAVENOUS

## 2023-08-08 MED ORDER — ACETAMINOPHEN 325 MG PO TABS: 650.0000 mg | ORAL_TABLET | Freq: Four times a day (QID) | ORAL | Status: DC | PRN

## 2023-08-08 MED ORDER — FUROSEMIDE 10 MG/ML IJ SOLN
40.0000 mg | Freq: Once | INTRAMUSCULAR | Status: AC
  Administered 2023-08-08: 40 mg via INTRAVENOUS
  Filled 2023-08-08: qty 4

## 2023-08-08 MED ORDER — INSULIN ASPART 100 UNIT/ML IJ SOLN
0.0000 [IU] | Freq: Three times a day (TID) | INTRAMUSCULAR | Status: DC
  Administered 2023-08-09: 2 [IU] via SUBCUTANEOUS
  Administered 2023-08-10 – 2023-08-11 (×3): 1 [IU] via SUBCUTANEOUS
  Filled 2023-08-08 (×4): qty 1

## 2023-08-08 MED ORDER — POTASSIUM CHLORIDE 10 MEQ/100ML IV SOLN
10.0000 meq | INTRAVENOUS | Status: AC
  Administered 2023-08-08 – 2023-08-09 (×4): 10 meq via INTRAVENOUS
  Filled 2023-08-08 (×4): qty 100

## 2023-08-08 MED ORDER — POTASSIUM CHLORIDE 10 MEQ/100ML IV SOLN
10.0000 meq | INTRAVENOUS | Status: AC
  Administered 2023-08-08 (×2): 10 meq via INTRAVENOUS
  Filled 2023-08-08 (×2): qty 100

## 2023-08-08 MED ORDER — ROSUVASTATIN CALCIUM 10 MG PO TABS: 10.0000 mg | ORAL_TABLET | Freq: Every day | ORAL | Status: DC

## 2023-08-08 NOTE — ED Notes (Signed)
Wife at bedside.

## 2023-08-08 NOTE — Assessment & Plan Note (Signed)
Glycemic protocol.  NPO and qccucheck q4 hours.

## 2023-08-08 NOTE — Assessment & Plan Note (Signed)
Pt has intermittent abd pain suspect from gallstones. Depending on MRCP will get gen surg and or GI.

## 2023-08-08 NOTE — H&P (Addendum)
History and Physical    Patient: Logan Harrell JXB:[id number] DOB: 04-16-1966 DOA: 08/08/2023 DOS: the patient was seen and examined on 08/08/2023 PCP: Patient, No Pcp Per  Patient coming from: Home   Chief Complaint:  Chief Complaint  Patient presents with   Leg Swelling    HPI: Artem Haberkorn is a 57 y.o. male with medical history significant for diabetes mellitus type 2, hypertension, obesity, sleep apnea presenting with leg swelling that is been going on for for the past 2 months or so over the past 2 weeks.  Patient also presenting with intermittent abdominal pain since January that is worse sometimes with eating sometimes unrelated to eating patient's appetite has decreased due to this and is not able to eat a full meal because he just ends up having nausea and vomiting.  Otherwise no chest pain but patient did report some shortness of breath no fevers chills blurred vision speech or gait issues no falls.  In chart review medications patient takes Shelly Coss however has no history Cushing's.  Labs show hypokalemia of 2.0 and glucose 110, Lft are elevated, Total bilirubin of 2.8. BNP 477.1 CBC shows wbc 6.5 hb of 12.3 and 261 .  Review of Systems: Review of Systems  Respiratory:  Positive for shortness of breath.   Cardiovascular:  Positive for leg swelling.  Gastrointestinal:  Positive for abdominal pain, nausea and vomiting.  All other systems reviewed and are negative.   Past Medical History:  Diagnosis Date   Arthritis    Diabetes mellitus without complication (HCC)    Hyperlipidemia    Hypertension    Sleep apnea    History reviewed. No pertinent surgical history. Social History:   reports that he has been smoking cigarettes. He has a 35 pack-year smoking history. He has never used smokeless tobacco. He reports current alcohol use of about 4.0 standard drinks of alcohol per week. He reports that he does not use drugs.  No Known Allergies  Family History  Problem Relation  Age of Onset   Diabetes Mother    Diabetes Father     Prior to Admission medications   Medication Sig Start Date End Date Taking? Authorizing Provider  aspirin EC 81 MG tablet Take 81 mg by mouth daily.   Yes [provider]  empagliflozin (JARDIANCE) 25 MG TABS tablet Take 25 mg by mouth daily.   Yes [provider]  JANUMET XR 50-1000 MG TB24 Take 1 tablet by mouth daily. 06/21/23  Yes [provider]  KORLYM 300 MG TABS Take 2 tablets by mouth in the morning and at bedtime. 2 tabs in am 1 in pm 07/19/23  Yes [provider]  lisinopril (PRINIVIL,ZESTRIL) 20 MG tablet Take 20 mg by mouth daily.   Yes [provider]  Multiple Vitamin (MULTIVITAMIN) tablet Take 1 tablet by mouth daily.   Yes [provider]  rosuvastatin (CRESTOR) 10 MG tablet Take 10 mg by mouth daily.   Yes [provider]  Semaglutide (OZEMPIC) 0.25 or 0.5 MG/DOSE SOPN Inject 1 mg into the skin once a week.    Yes [provider]  vitamin C (ASCORBIC ACID) 500 MG tablet Take 500 mg by mouth daily.   Yes [provider]  VITAMIN D, CHOLECALCIFEROL, PO Take 1-2 capsules by mouth daily.   Yes [provider]  Saxagliptin-Metformin (KOMBIGLYZE XR) 2.04-999 MG TB24 Take 1 tablet by mouth 2 (two) times daily. Patient not taking: Reported on 08/08/2023    [provider]  sildenafil (VIAGRA) 50 MG tablet  04/10/19   [provider]  spironolactone (ALDACTONE) 25 MG tablet Take 25 mg by mouth daily. Patient not taking: Reported on 08/08/2023 03/28/23   [provider]  Testosterone Enanthate (XYOSTED) 75 MG/0.5ML SOAJ Inject 75 mcg into the skin once a week. Patient not taking: Reported on 08/08/2023 05/24/19   Riki Altes, MD  TOUJEO SOLOSTAR 300 UNIT/ML SOPN  04/11/19   [provider]     Vitals:   08/08/23 1900 08/08/23 2109 08/08/23 2214 08/08/23 2231  BP: (!) 174/77 (!) 169/78 (!) 180/83   Pulse: 77  68 70   Resp: (!) 25 (!) 24 20   Temp:   98.3 F (36.8 C)   TempSrc:      SpO2: 94% 94% 91%   Weight:    123.8 kg  Height:    5\' 7"  (1.702 m)   Physical Exam Vitals and nursing note reviewed.  Constitutional:      General: He is not in acute distress. HENT:     Head: Normocephalic and atraumatic.     Right Ear: Hearing normal.     Left Ear: Hearing normal.     Nose: Nose normal. No nasal deformity.     Mouth/Throat:     Lips: Pink.     Tongue: No lesions.     Pharynx: Oropharynx is clear.  Eyes:     General: Lids are normal.     Extraocular Movements: Extraocular movements intact.  Cardiovascular:     Rate and Rhythm: Normal rate and regular rhythm.     Pulses: Normal pulses.     Heart sounds: Normal heart sounds.  Pulmonary:     Effort: Pulmonary effort is normal.     Breath sounds: Normal breath sounds.  Abdominal:     General: Bowel sounds are normal. There is no distension.     Palpations: Abdomen is soft. There is no mass.     Tenderness: There is no abdominal tenderness.  Musculoskeletal:     Right lower leg: 1+ Edema present.     Left lower leg: 1+ Edema present.  Skin:    General: Skin is warm.  Neurological:     General: No focal deficit present.     Mental Status: He is alert and oriented to person, place, and time.     Cranial Nerves: Cranial nerves 2-12 are intact.  Psychiatric:        Attention and Perception: Attention normal.        Mood and Affect: Mood normal.        Speech: Speech normal.        Behavior: Behavior normal. Behavior is cooperative.      Labs on Admission: I have personally reviewed following labs and imaging studies  CBC: Recent Labs  Lab 08/08/23 1329  WBC 6.5  HGB 12.3*  HCT 37.3*  MCV 70.2*  PLT 261   Basic Metabolic Panel: Recent Labs  Lab 08/08/23 1329 08/08/23 2108  NA 138  --   K <2.0* 2.1*  CL 97*  --   CO2 31  --   GLUCOSE 110*  --   BUN 6  --   CREATININE 0.86  --   CALCIUM 7.9*  --   MG 2.0  --     GFR: Estimated Creatinine Clearance: 116.7 mL/min (by C-G formula based on SCr of 0.86 mg/dL). Liver Function Tests: Recent Labs  Lab 08/08/23 1329  AST 216*  ALT 158*  ALKPHOS 50  BILITOT 2.8*  PROT 6.4*  ALBUMIN 3.6   Recent Labs  Lab 08/08/23 1329  LIPASE 30   No results for input(s): "AMMONIA" in the last 168 hours. Coagulation Profile: No results for input(s): "INR", "PROTIME" in the last 168 hours. Cardiac Enzymes: No results for input(s): "CKTOTAL", "CKMB", "CKMBINDEX", "TROPONINI" in the last 168 hours. BNP (last 3 results) No results for input(s): "PROBNP" in the last 8760 hours. HbA1C: No results for input(s): "HGBA1C" in the last 72 hours. CBG: Recent Labs  Lab 08/08/23 2217  GLUCAP 113*   Lipid Profile: No results for input(s): "CHOL", "HDL", "LDLCALC", "TRIG", "CHOLHDL", "LDLDIRECT" in the last 72 hours. Thyroid Function Tests: No results for input(s): "TSH", "T4TOTAL", "FREET4", "T3FREE", "THYROIDAB" in the last 72 hours. Anemia Panel: No results for input(s): "VITAMINB12", "FOLATE", "FERRITIN", "TIBC", "IRON", "RETICCTPCT" in the last 72 hours. Urinalysis No results found for: "COLORURINE", "APPEARANCEUR", "LABSPEC", "PHURINE", "GLUCOSEU", "HGBUR", "BILIRUBINUR", "KETONESUR", "PROTEINUR", "UROBILINOGEN", "NITRITE", "LEUKOCYTESUR"    Unresulted Labs (From admission, onward)     Start     Ordered   08/09/23 0501  Hepatitis panel, acute  Once,   R        08/09/23 0501   08/09/23 0500  Basic metabolic panel  Tomorrow morning,   R        08/08/23 2158   08/09/23 0500  Magnesium  Tomorrow morning,   R        08/08/23 2158   08/09/23 0500  Phosphorus  Tomorrow morning,   R        08/08/23 2158   08/08/23 2045  Gamma GT  Add-on,   AD        08/08/23 2044   08/08/23 1844  Hemoglobin A1c  Once,   R       Comments: To assess prior glycemic control    08/08/23 1853   08/08/23 1844  HIV Antibody (routine testing w rflx)  (HIV Antibody (Routine  testing w reflex) panel)  Once,   R        08/08/23 1853            Medications  sodium chloride flush (NS) 0.9 % injection 3 mL (3 mLs Intravenous Given 08/08/23 2318)  acetaminophen (TYLENOL) tablet 650 mg (has no administration in time range)    Or  acetaminophen (TYLENOL) suppository 650 mg (has no administration in time range)  morphine (PF) 2 MG/ML injection 2 mg (has no administration in time range)  ondansetron (ZOFRAN) tablet 4 mg (has no administration in time range)    Or  ondansetron (ZOFRAN) injection 4 mg (has no administration in time range)  hydrALAZINE (APRESOLINE) injection 5 mg (5 mg Intravenous Given 08/08/23 2310)  insulin aspart (novoLOG) injection 0-9 Units (has no administration in time range)  heparin injection 5,000 Units (5,000 Units Subcutaneous Given 08/08/23 2314)  pantoprazole (PROTONIX) injection 40 mg (40 mg Intravenous Given 08/08/23 1916)  aspirin EC tablet 81 mg (has no administration in time range)  lisinopril (ZESTRIL) tablet 20 mg (20 mg Oral Given 08/08/23 2317)  rosuvastatin (CRESTOR) tablet 10 mg (has no administration in time range)  potassium chloride 10 mEq in 100 mL IVPB (has no administration in time range)  miFEPRIStone TABS 600 mg (has no administration in time range)    And  miFEPRIStone TABS 300 mg (300 mg Oral Not Given 08/08/23 2300)  potassium chloride SA (KLOR-CON M) CR tablet 40 mEq (40 mEq Oral Given 08/08/23 1519)  potassium chloride 10  mEq in 100 mL IVPB (0 mEq Intravenous Stopped 08/08/23 1759)  furosemide (LASIX) injection 40 mg (40 mg Intravenous Given 08/08/23 1855)  potassium chloride SA (KLOR-CON M) CR tablet 40 mEq (40 mEq Oral Given 08/08/23 2316)    Radiological Exams on Admission: US Abdomen Limited RUQ (LIVER/GB)  Result Date: 08/08/2023 CLINICAL DATA:  Elevated transaminase level. EXAM: ULTRASOUND ABDOMEN LIMITED RIGHT UPPER QUADRANT COMPARISON:  None Available. FINDINGS: Gallbladder: Multiple small echogenic, mobile  layering stones are seen measuring up to 5 mm. Negative sonographic Murphy's sign. Normal gallbladder wall thickness. No pericholecystic fluid. Common bile duct: Diameter: 5 mm, within normal limits. No intrahepatic or extrahepatic biliary ductal dilatation. Liver: No focal lesion identified. Within normal limits in parenchymal echogenicity. Portal vein is patent on color Doppler imaging with normal direction of blood flow towards the liver. Other: None. IMPRESSION: Cholelithiasis without sonographic evidence of acute cholecystitis. Electronically Signed   By: Neita Garnet M.D.   On: 08/08/2023 17:58   US Venous Img Lower Unilateral Left  Result Date: 08/08/2023 CLINICAL DATA:  Left lower extremity edema for the past 2 weeks. Evaluate for DVT. EXAM: LEFT LOWER EXTREMITY VENOUS DOPPLER ULTRASOUND TECHNIQUE: Gray-scale sonography with graded compression, as well as color Doppler and duplex ultrasound were performed to evaluate the lower extremity deep venous systems from the level of the common femoral vein and including the common femoral, femoral, profunda femoral, popliteal and calf veins including the posterior tibial, peroneal and gastrocnemius veins when visible. The superficial great saphenous vein was also interrogated. Spectral Doppler was utilized to evaluate flow at rest and with distal augmentation maneuvers in the common femoral, femoral and popliteal veins. COMPARISON:  None Available. FINDINGS: Contralateral Common Femoral Vein: Respiratory phasicity is normal and symmetric with the symptomatic side. No evidence of thrombus. Normal compressibility. Common Femoral Vein: No evidence of thrombus. Normal compressibility, respiratory phasicity and response to augmentation. Saphenofemoral Junction: No evidence of thrombus. Normal compressibility and flow on color Doppler imaging. Profunda Femoral Vein: No evidence of thrombus. Normal compressibility and flow on color Doppler imaging. Femoral Vein: No  evidence of thrombus. Normal compressibility, respiratory phasicity and response to augmentation. Popliteal Vein: No evidence of thrombus. Normal compressibility, respiratory phasicity and response to augmentation. Calf Veins: No evidence of thrombus. Normal compressibility and flow on color Doppler imaging. Superficial Great Saphenous Vein: No evidence of thrombus. Normal compressibility. Other Findings:  None. IMPRESSION: No evidence of DVT within the left lower extremity. Electronically Signed   By: Simonne Come M.D.   On: 08/08/2023 16:41   DG Chest 2 View  Result Date: 08/08/2023 CLINICAL DATA:  Shortness of breath. EXAM: CHEST - 2 VIEW COMPARISON:  None Available. FINDINGS: The heart size and mediastinal contours are within normal limits. Both lungs are clear. The visualized skeletal structures are unremarkable. IMPRESSION: No active cardiopulmonary disease. Electronically Signed   By: Lupita Raider M.D.   On: 08/08/2023 15:59     Data Reviewed: Relevant notes from primary care and specialist visits, past discharge summaries as available in EHR, including Care Everywhere. Prior diagnostic testing as pertinent to current admission diagnoses Updated medications and problem lists for reconciliation ED course, including vitals, labs, imaging, treatment and response to treatment Triage notes, nursing and pharmacy notes and ED provider's notes Notable results as noted in HPI  Assessment and Plan: * Bilateral leg edema Admission for c/o sob leg edema and new onset CHF.  Lasix x 1 given in ed.  We will obtain 2 d echo.  Strict I/O and daily weights.    Elevated troponin EKG shows sinus rhythm 62 PR interval 154 QRS 100 LVH with a tall R wave in lead aVL QTc prolonged at 544. BNP of 477 and troponin of 82.  Will trend troponin ? Increased demand,  and seek cardiology consult.      Hypokalemia 2/2 to vomiting.  Pharmacy consult for electrolyte management.   Cholelithiasis without  obstruction Pt has intermittent abd pain suspect from gallstones. Depending on MRCP will get gen surg and or GI.   Elevated LFTs D/d/ choledocholithiasis. Ggt and MRCP if high .  GI consult per AM team.  Tylenol level pending, a./c hep panel / ethanol level.    History of hypertension Vitals:   08/08/23 1321 08/08/23 1600 08/08/23 1630 08/08/23 1900  BP: (!) 173/82 (!) 177/96 (!) 187/99 (!) 174/77  Continue lisinopril and PRN hydralazine.    History of type 2 diabetes mellitus Glycemic protocol.  NPO and qccucheck q4 hours.     DVT prophylaxis:  Heparin  Consults:  None   Advance Care Planning:    Code Status: Full Code   Family Communication:  Wife   Disposition Plan:  Home   Severity of Illness: The appropriate patient status for this patient is INPATIENT. Inpatient status is judged to be reasonable and necessary in order to provide the required intensity of service to ensure the patient's safety. The patient's presenting symptoms, physical exam findings, and initial radiographic and laboratory data in the context of their chronic comorbidities is felt to place them at high risk for further clinical deterioration. Furthermore, it is not anticipated that the patient will be medically stable for discharge from the hospital within 2 midnights of admission.   * I certify that at the point of admission it is my clinical judgment that the patient will require inpatient hospital care spanning beyond 2 midnights from the point of admission due to high intensity of service, high risk for further deterioration and high frequency of surveillance required.*  Author: Gertha Calkin, MD 08/08/2023 11:27 PM  For on call review [email or web address].

## 2023-08-08 NOTE — Assessment & Plan Note (Addendum)
2/2 to vomiting.  Pharmacy consult for electrolyte management.

## 2023-08-08 NOTE — Assessment & Plan Note (Signed)
Admission for c/o sob leg edema and new onset CHF.  Lasix x 1 given in ed.  We will obtain 2 d echo.  Strict I/O and daily weights.

## 2023-08-08 NOTE — Assessment & Plan Note (Signed)
EKG shows sinus rhythm 62 PR interval 154 QRS 100 LVH with a tall R wave in lead aVL QTc prolonged at 544. BNP of 477 and troponin of 82.  Will trend troponin ? Increased demand,  and seek cardiology consult.

## 2023-08-08 NOTE — ED Provider Notes (Signed)
[facility] Provider Note    Event Date/Time   First MD Initiated Contact with Patient 08/08/23 1500     (approximate)   History   Leg Swelling   HPI Logan Harrell is a 57 y.o. male with PMH of DM2, HTN, HLD presenting today for leg swelling.  Patient notes left leg swelling that has been present for the past 2 weeks.  He has tried elevation and compression wraps with some benefits and was seen in the walk-in clinic today who advised him to come to the emergency department for further evaluation.  Pain symptoms minimal on the left leg.  Denies any swelling to the right leg.  Has had mild shortness of breath for several months that has not significantly worsened recently.  Otherwise denying fever, chills, chest pain.  Patient has been on Ozempic for several months and does note decreased appetite and has had intermittent abdominal pain associated with nausea symptoms but those are not worsened recently.  Otherwise does not have any new medications.     Physical Exam   Triage Vital Signs: ED Triage Vitals  Encounter Vitals Group     BP 08/08/23 1321 (!) 173/82     Systolic BP Percentile --      Diastolic BP Percentile --      Pulse Rate 08/08/23 1321 68     Resp 08/08/23 1321 17     Temp 08/08/23 1321 98.1 F (36.7 C)     Temp Source 08/08/23 1321 Oral     SpO2 08/08/23 1321 96 %     Weight --      Height --      Head Circumference --      Peak Flow --      Pain Score 08/08/23 1312 4     Pain Loc --      Pain Education --      Exclude from Growth Chart --     Most recent vital signs: Vitals:   08/08/23 1321  BP: (!) 173/82  Pulse: 68  Resp: 17  Temp: 98.1 F (36.7 C)  SpO2: 96%   Physical Exam: I have reviewed the vital signs and nursing notes. General: Awake, alert, no acute distress.  Nontoxic appearing. Head:  Atraumatic, normocephalic.   ENT:  EOM intact, PERRL. Oral mucosa is pink and moist with no lesions. Neck: Neck is supple with  full range of motion, No meningeal signs. Cardiovascular:  RRR, No murmurs. Peripheral pulses palpable and equal bilaterally. Respiratory:  Symmetrical chest wall expansion.  No rhonchi, rales, or wheezes.  Good air movement throughout.  No use of accessory muscles.   Musculoskeletal:  No cyanosis.  1+ pitting edema to bilateral lower extremities without signs of erythema or warmth.  Moving extremities with full ROM Abdomen:  Soft, nontender, nondistended. Neuro:  GCS 15, moving all four extremities, interacting appropriately. Speech clear. Psych:  Calm, appropriate.   Skin:  Warm, dry, no rash.     ED Results / Procedures / Treatments   Labs (all labs ordered are listed, but only abnormal results are displayed) Labs Reviewed  BASIC METABOLIC PANEL - Abnormal; Notable for the following components:      Result Value   Potassium <2.0 (*)    Chloride 97 (*)    Glucose, Bld 110 (*)    Calcium 7.9 (*)    All other components within normal limits  CBC - Abnormal; Notable for the following components:   Hemoglobin 12.3 (*)  HCT 37.3 (*)    MCV 70.2 (*)    MCH 23.2 (*)    All other components within normal limits  BRAIN NATRIURETIC PEPTIDE - Abnormal; Notable for the following components:   B Natriuretic Peptide 477.1 (*)    All other components within normal limits  HEPATIC FUNCTION PANEL - Abnormal; Notable for the following components:   Total Protein 6.4 (*)    AST 216 (*)    ALT 158 (*)    Total Bilirubin 2.8 (*)    Bilirubin, Direct 0.3 (*)    Indirect Bilirubin 2.5 (*)    All other components within normal limits  MAGNESIUM  LIPASE, BLOOD  BASIC METABOLIC PANEL     EKG My EKG interpretation: Rate of 62, sinus tachycardia with occasional PAC.  Prolonged QTc at 544.  No acute ST elevations or depressions.   RADIOLOGY Independently interpreted chest x-ray with maybe some concern for slight vascular congestion.  Otherwise no focal findings.   PROCEDURES:  Critical  Care performed: No  Procedures   MEDICATIONS ORDERED IN ED: Medications  potassium chloride SA (KLOR-CON M) CR tablet 40 mEq (40 mEq Oral Given 08/08/23 1519)  potassium chloride 10 mEq in 100 mL IVPB (0 mEq Intravenous Stopped 08/08/23 1759)     IMPRESSION / MDM / ASSESSMENT AND PLAN / ED COURSE  I reviewed the triage vital signs and the nursing notes.                              Differential diagnosis includes, but is not limited to, hypokalemia secondary to nausea/vomiting and poor p.o. intake, heart failure exacerbation, DVT.  Patient's presentation is most consistent with acute presentation with potential threat to life or bodily function.  Patient is a 57 year old male presenting today for leg swelling initially.  Duplex ultrasound reveals no evidence of DVT.  Pitting edema present bilaterally as well as elevated BNP concerning for possible volume overload in the setting of undiagnosed heart failure.  Separately, patient's potassium was found to be undetectably low at less than 2.  Patient given oral and IV repletion of potassium.  Also found to have elevated LFTs with elevated indirect bilirubin.  Right upper quadrant ultrasound with no evidence of cholecystitis or common bile duct dilation.  Patient will be admitted to hospitalist for profound hypokalemia as well as further heart failure workup.  The patient is on the cardiac monitor to evaluate for evidence of arrhythmia and/or significant heart rate changes. Clinical Course as of 08/08/23 1800  Mon Aug 08, 2023  1500 Potassium(!!): <2.0 [DW]  1510 DG Chest 2 View Independently viewed and interpreted chest x-ray without obvious focal consolidations.  There is some concern for possible mild venous congestion [DW]  1510 B Natriuretic Peptide(!): 477.1 Elevated BNP with bilateral pitting edema concern for possible volume overload related to heart failure with some pulmonary edema seen on chest x-ray. [DW]  1514 Hepatic function  panel(!) Elevated indirect bilirubin present along with mild LFTs elevation. [DW]  1710 US Venous Img Lower Unilateral Left No evidence of DVT [DW]    Clinical Course User Index [DW] Logan Lima, MD     FINAL CLINICAL IMPRESSION(S) / ED DIAGNOSES   Final diagnoses:  Hypokalemia  Leg swelling     Rx / DC Orders   ED Discharge Orders     None        Note:  This document was prepared using Dragon  voice recognition software and may include unintentional dictation errors.   Logan Lima, MD 08/08/23 [phone]

## 2023-08-08 NOTE — Assessment & Plan Note (Addendum)
D/d/ choledocholithiasis. Ggt and MRCP if high .  GI consult per AM team.  Tylenol level pending, a./c hep panel / ethanol level.

## 2023-08-08 NOTE — Progress Notes (Signed)
PHARMACY CONSULT NOTE - ELECTROLYTES  Pharmacy Consult for Electrolyte Monitoring and Replacement   Recent Labs:   CrCl cannot be calculated (Unknown ideal weight.). Potassium (mmol/L)  Date Value  08/08/2023 <2.0 (LL)   Magnesium (mg/dL)  Date Value  54/27/0623 2.0   Calcium (mg/dL)  Date Value  76/28/3151 7.9 (L)   Albumin (g/dL)  Date Value  76/16/0737 3.6   Sodium (mmol/L)  Date Value  08/08/2023 138   Corrected Ca: 8.2 mg/dL  Assessment  Logan Harrell is a 57 y.o. male presenting with leg swelling, hypokalemia. PMH significant for DM2, HTN, HLD . Pharmacy has been consulted to monitor and replace electrolytes.  Diet: 2 gm Na,  NPO after midnight on 8/20 MIVF: none Pertinent medications: given lasix x 1,   PTA: lisinopril, janumet, jardiance, Korlym- can cause periph edema, hypokalemia  Goal of Therapy: Electrolytes WNL  Plan:  MD ordered KCL 20 meq IV x 2  and KCL 40 meq PO x 1 in ED Check K at 2100 Check BMP, Mg, Phos with AM labs  Thank you for allowing pharmacy to be a part of this patient's care.  Angelique Blonder, PharmD Clinical Pharmacist 08/08/2023 8:05 PM

## 2023-08-08 NOTE — ED Notes (Signed)
MD informed of pt K+ of <2.0.

## 2023-08-08 NOTE — Progress Notes (Signed)
PHARMACY CONSULT NOTE - ELECTROLYTES  Pharmacy Consult for Electrolyte Monitoring and Replacement   Recent Labs:   CrCl cannot be calculated (Unknown ideal weight.). Potassium (mmol/L)  Date Value  08/08/2023 2.1 (LL)   Magnesium (mg/dL)  Date Value  16/09/9603 2.0   Calcium (mg/dL)  Date Value  54/08/8118 7.9 (L)   Albumin (g/dL)  Date Value  14/78/2956 3.6   Sodium (mmol/L)  Date Value  08/08/2023 138   Corrected Ca: 8.2 mg/dL  Assessment  Logan Harrell is a 57 y.o. male presenting with leg swelling, hypokalemia. PMH significant for DM2, HTN, HLD . Pharmacy has been consulted to monitor and replace electrolytes.  Diet: 2 gm Na,  NPO after midnight on 8/20 MIVF: none Pertinent medications: given lasix x 1,   PTA: lisinopril, janumet, jardiance, Korlym- can cause periph edema, hypokalemia  Goal of Therapy: Electrolytes WNL  Plan:  8/19 @2108  K= 2.1   will order KCL 10 meq IV x 4  and KCL 40 meq PO x 1  Check BMP, Mg, Phos with AM labs  Thank you for allowing pharmacy to be a part of this patient's care.  Angelique Blonder, PharmD Clinical Pharmacist 08/08/2023 9:56 PM

## 2023-08-08 NOTE — ED Triage Notes (Signed)
Pt comes with c/o left leg pain and swelling. Pt states his leg feels slick. Pt is diabetic. Pt states sob with exertion. Pt states  this all started 12 days ago.

## 2023-08-08 NOTE — Hospital Course (Addendum)
Admit reason: Bl pitting edema / hypokalemia - NEW CHF eval  intermittent abd pain -MRCP .

## 2023-08-08 NOTE — Assessment & Plan Note (Signed)
Vitals:   08/08/23 1321 08/08/23 1600 08/08/23 1630 08/08/23 1900  BP: (!) 173/82 (!) 177/96 (!) 187/99 (!) 174/77  Continue lisinopril and PRN hydralazine.

## 2023-08-09 ENCOUNTER — Observation Stay (HOSPITAL_COMMUNITY)
Admit: 2023-08-09 | Discharge: 2023-08-09 | Disposition: A | Discharge status: Home / Self Care | Payer: BC Managed Care – PPO | Attending: Internal Medicine | Admitting: Internal Medicine

## 2023-08-09 DIAGNOSIS — Z6841 Body Mass Index (BMI) 40.0 and over, adult: Secondary | ICD-10-CM | POA: Diagnosis not present

## 2023-08-09 DIAGNOSIS — R600 Localized edema: Secondary | ICD-10-CM

## 2023-08-09 DIAGNOSIS — Z794 Long term (current) use of insulin: Secondary | ICD-10-CM | POA: Diagnosis not present

## 2023-08-09 DIAGNOSIS — K8020 Calculus of gallbladder without cholecystitis without obstruction: Secondary | ICD-10-CM

## 2023-08-09 DIAGNOSIS — I1A0 Resistant hypertension: Secondary | ICD-10-CM | POA: Diagnosis present

## 2023-08-09 DIAGNOSIS — R0602 Shortness of breath: Secondary | ICD-10-CM | POA: Diagnosis not present

## 2023-08-09 DIAGNOSIS — R7989 Other specified abnormal findings of blood chemistry: Secondary | ICD-10-CM

## 2023-08-09 DIAGNOSIS — I2489 Other forms of acute ischemic heart disease: Secondary | ICD-10-CM | POA: Diagnosis present

## 2023-08-09 DIAGNOSIS — Z8679 Personal history of other diseases of the circulatory system: Secondary | ICD-10-CM

## 2023-08-09 DIAGNOSIS — Z23 Encounter for immunization: Secondary | ICD-10-CM | POA: Diagnosis not present

## 2023-08-09 DIAGNOSIS — M7989 Other specified soft tissue disorders: Secondary | ICD-10-CM | POA: Diagnosis present

## 2023-08-09 DIAGNOSIS — E876 Hypokalemia: Secondary | ICD-10-CM | POA: Diagnosis present

## 2023-08-09 DIAGNOSIS — F17210 Nicotine dependence, cigarettes, uncomplicated: Secondary | ICD-10-CM | POA: Diagnosis present

## 2023-08-09 DIAGNOSIS — A4189 Other specified sepsis: Secondary | ICD-10-CM | POA: Diagnosis not present

## 2023-08-09 DIAGNOSIS — G4733 Obstructive sleep apnea (adult) (pediatric): Secondary | ICD-10-CM | POA: Diagnosis present

## 2023-08-09 DIAGNOSIS — I160 Hypertensive urgency: Secondary | ICD-10-CM | POA: Diagnosis present

## 2023-08-09 DIAGNOSIS — Z8639 Personal history of other endocrine, nutritional and metabolic disease: Secondary | ICD-10-CM

## 2023-08-09 DIAGNOSIS — E785 Hyperlipidemia, unspecified: Secondary | ICD-10-CM | POA: Diagnosis present

## 2023-08-09 DIAGNOSIS — U071 COVID-19: Secondary | ICD-10-CM | POA: Diagnosis not present

## 2023-08-09 DIAGNOSIS — R718 Other abnormality of red blood cells: Secondary | ICD-10-CM | POA: Diagnosis present

## 2023-08-09 DIAGNOSIS — E1165 Type 2 diabetes mellitus with hyperglycemia: Secondary | ICD-10-CM | POA: Diagnosis present

## 2023-08-09 DIAGNOSIS — R059 Cough, unspecified: Secondary | ICD-10-CM | POA: Diagnosis not present

## 2023-08-09 DIAGNOSIS — K760 Fatty (change of) liver, not elsewhere classified: Secondary | ICD-10-CM | POA: Diagnosis present

## 2023-08-09 DIAGNOSIS — I509 Heart failure, unspecified: Secondary | ICD-10-CM | POA: Diagnosis present

## 2023-08-09 DIAGNOSIS — E2601 Conn's syndrome: Secondary | ICD-10-CM | POA: Diagnosis present

## 2023-08-09 DIAGNOSIS — I110 Hypertensive heart disease with heart failure: Secondary | ICD-10-CM | POA: Diagnosis present

## 2023-08-09 LAB — HEPATIC FUNCTION PANEL
ALT: 142 U/L — ABNORMAL HIGH (ref 0–44)
AST: 174 U/L — ABNORMAL HIGH (ref 15–41)
Albumin: 3.5 g/dL (ref 3.5–5.0)
Alkaline Phosphatase: 47 U/L (ref 38–126)
Bilirubin, Direct: 0.3 mg/dL — ABNORMAL HIGH (ref 0.0–0.2)
Indirect Bilirubin: 2.9 mg/dL — ABNORMAL HIGH (ref 0.3–0.9)
Total Bilirubin: 3.2 mg/dL — ABNORMAL HIGH (ref 0.3–1.2)
Total Protein: 6.3 g/dL — ABNORMAL LOW (ref 6.5–8.1)

## 2023-08-09 LAB — MAGNESIUM: Magnesium: 2 mg/dL (ref 1.7–2.4)

## 2023-08-09 LAB — BASIC METABOLIC PANEL
Anion gap: 12 (ref 5–15)
BUN: 6 mg/dL (ref 6–20)
CO2: 29 mmol/L (ref 22–32)
Calcium: 7.9 mg/dL — ABNORMAL LOW (ref 8.9–10.3)
Chloride: 103 mmol/L (ref 98–111)
Creatinine, Ser: 0.73 mg/dL (ref 0.61–1.24)
GFR, Estimated: >60 mL/min (ref >60)
Glucose, Bld: 102 mg/dL — ABNORMAL HIGH (ref 70–99)
Potassium: 2.1 mmol/L — CL (ref 3.5–5.1)
Sodium: 144 mmol/L (ref 135–145)

## 2023-08-09 LAB — ECHOCARDIOGRAM COMPLETE
AR max vel: 3.2 cm2
AV Area VTI: 3.45 cm2
AV Area mean vel: 3.08 cm2
AV Mean grad: 2 mmHg
AV Peak grad: 4.4 mmHg
Ao pk vel: 1.05 m/s
Area-P 1/2: 3.24 cm2
Height: 67 in
MV VTI: 2.74 cm2
S' Lateral: 3.3 cm
Weight: 4368 [oz_av]

## 2023-08-09 LAB — GLUCOSE, CAPILLARY
Glucose-Capillary: 89 mg/dL (ref 70–99)
Glucose-Capillary: 89 mg/dL (ref 70–99)
Glucose-Capillary: 158 mg/dL — ABNORMAL HIGH (ref 70–99)
Glucose-Capillary: 100 mg/dL — ABNORMAL HIGH (ref 70–99)

## 2023-08-09 LAB — HEPATITIS PANEL, ACUTE
HCV Ab: NONREACTIVE
Hep A IgM: NONREACTIVE
Hep B C IgM: NONREACTIVE
Hepatitis B Surface Ag: NONREACTIVE

## 2023-08-09 LAB — TROPONIN I (HIGH SENSITIVITY)
Troponin I (High Sensitivity): 90 ng/L — ABNORMAL HIGH (ref <18)
Troponin I (High Sensitivity): 90 ng/L — ABNORMAL HIGH (ref <18)

## 2023-08-09 LAB — GAMMA GT: GGT: 19 U/L (ref 7–50)

## 2023-08-09 LAB — HIV ANTIBODY (ROUTINE TESTING W REFLEX): HIV Screen 4th Generation wRfx: NONREACTIVE

## 2023-08-09 LAB — PHOSPHORUS: Phosphorus: 2.7 mg/dL (ref 2.5–4.6)

## 2023-08-09 LAB — HEMOGLOBIN A1C
Hgb A1c MFr Bld: 5.8 % — ABNORMAL HIGH (ref 4.8–5.6)
Mean Plasma Glucose: 119.76 mg/dL

## 2023-08-09 LAB — POTASSIUM: Potassium: 2.4 mmol/L — CL (ref 3.5–5.1)

## 2023-08-09 MED ORDER — POTASSIUM CHLORIDE 10 MEQ/100ML IV SOLN
10.0000 meq | INTRAVENOUS | Status: AC
  Administered 2023-08-10 (×3): 10 meq via INTRAVENOUS
  Filled 2023-08-09 (×3): qty 100

## 2023-08-09 MED ORDER — SPIRONOLACTONE 25 MG PO TABS
25.0000 mg | ORAL_TABLET | Freq: Every day | ORAL | Status: DC
  Administered 2023-08-09 – 2023-08-11 (×3): 25 mg via ORAL
  Filled 2023-08-09 (×3): qty 1

## 2023-08-09 MED ORDER — POTASSIUM CHLORIDE 10 MEQ/100ML IV SOLN
10.0000 meq | INTRAVENOUS | Status: AC
  Administered 2023-08-09 (×5): 10 meq via INTRAVENOUS
  Filled 2023-08-09 (×6): qty 100

## 2023-08-09 MED ORDER — PROSOURCE PLUS PO LIQD
30.0000 mL | Freq: Two times a day (BID) | ORAL | Status: DC
  Administered 2023-08-09 – 2023-08-15 (×8): 30 mL via ORAL
  Filled 2023-08-09 (×13): qty 30

## 2023-08-09 MED ORDER — MELATONIN 5 MG PO TABS
2.5000 mg | ORAL_TABLET | Freq: Every day | ORAL | Status: DC
  Administered 2023-08-09 – 2023-08-14 (×6): 2.5 mg via ORAL
  Filled 2023-08-09 (×6): qty 1

## 2023-08-09 MED ORDER — BOOST / RESOURCE BREEZE PO LIQD CUSTOM
1.0000 | Freq: Three times a day (TID) | ORAL | Status: DC
  Administered 2023-08-09 – 2023-08-15 (×11): 1 via ORAL

## 2023-08-09 MED ORDER — ENOXAPARIN SODIUM 60 MG/0.6ML IJ SOSY
0.5000 mg/kg | PREFILLED_SYRINGE | INTRAMUSCULAR | Status: DC
  Administered 2023-08-09: 60 mg via SUBCUTANEOUS
  Filled 2023-08-09: qty 0.6

## 2023-08-09 MED ORDER — ENOXAPARIN SODIUM 60 MG/0.6ML IJ SOSY: 0.5000 mg/kg | PREFILLED_SYRINGE | INTRAMUSCULAR | Status: DC

## 2023-08-09 MED ORDER — METOPROLOL TARTRATE 5 MG/5ML IV SOLN
5.0000 mg | Freq: Four times a day (QID) | INTRAVENOUS | Status: DC | PRN
  Administered 2023-08-09 – 2023-08-11 (×3): 5 mg via INTRAVENOUS
  Filled 2023-08-09 (×3): qty 5

## 2023-08-09 MED ORDER — ADULT MULTIVITAMIN W/MINERALS CH
1.0000 | ORAL_TABLET | Freq: Every day | ORAL | Status: DC
  Administered 2023-08-09 – 2023-08-15 (×7): 1 via ORAL
  Filled 2023-08-09 (×7): qty 1

## 2023-08-09 MED ORDER — LORAZEPAM 0.5 MG PO TABS
0.5000 mg | ORAL_TABLET | Freq: Three times a day (TID) | ORAL | Status: DC | PRN
  Administered 2023-08-11 (×2): 0.5 mg via ORAL
  Filled 2023-08-09 (×2): qty 1

## 2023-08-09 MED ORDER — POTASSIUM CHLORIDE CRYS ER 20 MEQ PO TBCR
40.0000 meq | EXTENDED_RELEASE_TABLET | Freq: Once | ORAL | Status: AC
  Administered 2023-08-09: 40 meq via ORAL
  Filled 2023-08-09: qty 2

## 2023-08-09 NOTE — Progress Notes (Signed)
Received critical lab value for potassium 2.1.  Messaged Dr. Para March with this value.

## 2023-08-09 NOTE — Evaluation (Signed)
Occupational Therapy Evaluation Patient Details Name: Fawzi Lavinder MRN: [id number] DOB: [birth year] Today's Date: 08/09/2023   History of Present Illness 57 y.o. male with medical history significant for diabetes mellitus type 2, hypertension, obesity, sleep apnea presenting with leg swelling that is been going on for for the past 2 months or so over the past 2 weeks.  Patient also presenting with intermittent abdominal pain since January that is worse sometimes with eating sometimes unrelated to eating patient's appetite has decreased due to this and is not able to eat a full meal because he just ends up having nausea and vomiting.  Otherwise no chest pain but patient did report some shortness of breath no fevers chills blurred vision speech or gait issues no falls   Clinical Impression   Upon entering the room, pt supine in bed with wife present in room. Pt reports being Ind at baseline without use of AD and working two jobs. Pt performs bed mobility without issue and dons pants with sit <>stand from EOB independently. Pt reports toileting needs and demonstrates mobility to bathroom, transfer, hygiene, and clothing management independently. Pt's K+ is 2.1 but via secure chat pt was cleared by MD for mobility within room during evaluation. Pt with no acute OT needs at this time. OT to complete orders.      If plan is discharge home, recommend the following:      Functional Status Assessment  Patient has not had a recent decline in their functional status  Equipment Recommendations  None recommended by OT       Precautions / Restrictions Precautions Precautions: None      Mobility Bed Mobility Overal bed mobility: Independent                  Transfers Overall transfer level: Independent Equipment used: None                      Balance Overall balance assessment: Modified Independent                                         ADL either performed  or assessed with clinical judgement   ADL Overall ADL's : Independent                                             Vision Patient Visual Report: No change from baseline              Pertinent Vitals/Pain Pain Assessment Pain Assessment: No/denies pain     Extremity/Trunk Assessment Upper Extremity Assessment Upper Extremity Assessment: Overall WFL for tasks assessed   Lower Extremity Assessment Lower Extremity Assessment: Overall WFL for tasks assessed       Communication     Cognition Arousal: Alert Behavior During Therapy: WFL for tasks assessed/performed Overall Cognitive Status: Within Functional Limits for tasks assessed                                                  Home Living Family/patient expects to be discharged to:: Private residence Living Arrangements: Spouse/significant other Available Help at Discharge: Family Type of Home:  House Home Access: Ramped entrance;Stairs to enter Entrance Stairs-Number of Steps: 4   Home Layout: One level;Other (Comment) (1 step up insdie of home)               Home Equipment: None          Prior Functioning/Environment Prior Level of Function : Independent/Modified Independent;Working/employed;Driving                                 OT Goals(Current goals can be found in the care plan section) Acute Rehab OT Goals Patient Stated Goal: to return home and eat OT Goal Formulation: With patient/family Time For Goal Achievement: 08/09/23 Potential to Achieve Goals: Good  OT Frequency:         AM-PAC OT "6 Clicks" Daily Activity     Outcome Measure Help from another person eating meals?: None Help from another person taking care of personal grooming?: None Help from another person toileting, which includes using toliet, bedpan, or urinal?: None Help from another person bathing (including washing, rinsing, drying)?: None Help from another person to put on  and taking off regular upper body clothing?: None Help from another person to put on and taking off regular lower body clothing?: None 6 Click Score: 24   End of Session Nurse Communication: Mobility status  Activity Tolerance: Patient tolerated treatment well Patient left: with call bell/phone within reach;with family/visitor present;in bed                   Time: 1610-9604 OT Time Calculation (min): 12 min Charges:  OT General Charges $OT Visit: 1 Visit OT Evaluation $OT Eval Low Complexity: 1 Low  Jackquline Denmark, MS, OTR/L , CBIS ascom [phone]  08/09/23, 10:16 AM

## 2023-08-09 NOTE — Progress Notes (Signed)
Progress Note   Patient: Logan Harrell UXN:[id number] DOB: [birth year] DOA: 08/08/2023     0 DOS: the patient was seen and examined on 08/09/2023   Brief hospital course: Logan Harrell is a 57 y.o. male with medical history significant for diabetes mellitus type 2, hypertension, obesity, sleep apnea presenting with leg swelling that is been going on for for the past 2 months or so over the past 2 weeks.  Patient also presenting with intermittent abdominal pain since January that is worse sometimes with eating sometimes unrelated to eating patient's appetite has decreased due to this and is not able to eat a full meal because of nausea and vomiting.   Assessment and Plan: * Bilateral leg edema ? New onset CHF vs hepatic injury. Left greater than right leg swelling. Will check Echo. Monitor LFT. Avoid hepatotoxic drugs. Strict I/O and daily weights.   Elevated troponin EKG shows sinus rhythm 62 PR interval 154 QRS 100 LVH with a tall R wave in lead aVL QTc prolonged at 544. Mifepristone can cause elevated Qtc, will hold Korlym. BNP of 477 and troponin of 82.  Check Echo. Will get cardiology consult.   Severe Hypokalemia Due to GI losses vs Mifepristone adverse effect. Continue to replace potassium per Pharmacy protocol. Korlym held.  Cholelithiasis without obstruction Elevated LFTs RUQ uls showed choledocholithiasis, no cholecystitis. GGT wnl.  Intermittent abdominal pain, nausea and vomiting. Clear liquid diet and advance as tolerated. Acute hep panel pending. Hold statin. Continue to trend LFT.  History of hypertension Continue lisinopril and PRN hydralazine.   History of type 2 diabetes mellitus Accucheks, sliding scale insulin. Hypoglycemia protocol.     Subjective: Patient is seen and examined today morning. He is lying in bed, feels weak, has poor appetite. Mild abdominal discomfort.   Physical Exam: Vitals:   08/08/23 2231 08/09/23 0106 08/09/23 0246 08/09/23 0735   BP:  (!) 186/95 (!) 165/83 (!) 156/81  Pulse:  71 69 64  Resp:    17  Temp:    98.9 F (37.2 C)  TempSrc:      SpO2:   98% 97%  Weight: 123.8 kg     Height: 5\' 7"  (1.702 m)      General -  Middle aged African American male, mild distress due to pain HEENT - PERRLA, EOMI, atraumatic head, non tender sinuses. Lung - Clear, rales, rhonchi, wheezes. Heart - S1, S2 heard, no murmurs, rubs, trace left > right pedal edema Abdomen - soft, RUQ tender, obese, bowel sounds good. Neuro - Alert, awake and oriented x 3, non focal exam. Skin - Warm and dry. Data Reviewed:     Latest Ref Rng & Units 08/08/2023    1:29 PM  CBC  WBC 4.0 - 10.5 K/uL 6.5   Hemoglobin 13.0 - 17.0 g/dL 25.4   Hematocrit 27.0 - 52.0 % 37.3   Platelets 150 - 400 K/uL 261        Latest Ref Rng & Units 08/09/2023    4:45 AM 08/08/2023    9:08 PM 08/08/2023    1:29 PM  BMP  Glucose 70 - 99 mg/dL 623   762   BUN 6 - 20 mg/dL 6   6   Creatinine 8.31 - 1.24 mg/dL 5.17   6.16   Sodium 073 - 145 mmol/L 144   138   Potassium 3.5 - 5.1 mmol/L 2.1  2.1  <2.0   Chloride 98 - 111 mmol/L 103   97   CO2 22 -  32 mmol/L 29   31   Calcium 8.9 - 10.3 mg/dL 7.9   7.9    US Abdomen Limited RUQ (LIVER/GB)  Result Date: 08/08/2023 CLINICAL DATA:  Elevated transaminase level. EXAM: ULTRASOUND ABDOMEN LIMITED RIGHT UPPER QUADRANT COMPARISON:  None Available. FINDINGS: Gallbladder: Multiple small echogenic, mobile layering stones are seen measuring up to 5 mm. Negative sonographic Murphy's sign. Normal gallbladder wall thickness. No pericholecystic fluid. Common bile duct: Diameter: 5 mm, within normal limits. No intrahepatic or extrahepatic biliary ductal dilatation. Liver: No focal lesion identified. Within normal limits in parenchymal echogenicity. Portal vein is patent on color Doppler imaging with normal direction of blood flow towards the liver. Other: None. IMPRESSION: Cholelithiasis without sonographic evidence of acute  cholecystitis. Electronically Signed   By: Neita Garnet M.D.   On: 08/08/2023 17:58   US Venous Img Lower Unilateral Left  Result Date: 08/08/2023 CLINICAL DATA:  Left lower extremity edema for the past 2 weeks. Evaluate for DVT. EXAM: LEFT LOWER EXTREMITY VENOUS DOPPLER ULTRASOUND TECHNIQUE: Gray-scale sonography with graded compression, as well as color Doppler and duplex ultrasound were performed to evaluate the lower extremity deep venous systems from the level of the common femoral vein and including the common femoral, femoral, profunda femoral, popliteal and calf veins including the posterior tibial, peroneal and gastrocnemius veins when visible. The superficial great saphenous vein was also interrogated. Spectral Doppler was utilized to evaluate flow at rest and with distal augmentation maneuvers in the common femoral, femoral and popliteal veins. COMPARISON:  None Available. FINDINGS: Contralateral Common Femoral Vein: Respiratory phasicity is normal and symmetric with the symptomatic side. No evidence of thrombus. Normal compressibility. Common Femoral Vein: No evidence of thrombus. Normal compressibility, respiratory phasicity and response to augmentation. Saphenofemoral Junction: No evidence of thrombus. Normal compressibility and flow on color Doppler imaging. Profunda Femoral Vein: No evidence of thrombus. Normal compressibility and flow on color Doppler imaging. Femoral Vein: No evidence of thrombus. Normal compressibility, respiratory phasicity and response to augmentation. Popliteal Vein: No evidence of thrombus. Normal compressibility, respiratory phasicity and response to augmentation. Calf Veins: No evidence of thrombus. Normal compressibility and flow on color Doppler imaging. Superficial Great Saphenous Vein: No evidence of thrombus. Normal compressibility. Other Findings:  None. IMPRESSION: No evidence of DVT within the left lower extremity. Electronically Signed   By: Simonne Come M.D.    On: 08/08/2023 16:41   DG Chest 2 View  Result Date: 08/08/2023 CLINICAL DATA:  Shortness of breath. EXAM: CHEST - 2 VIEW COMPARISON:  None Available. FINDINGS: The heart size and mediastinal contours are within normal limits. Both lungs are clear. The visualized skeletal structures are unremarkable. IMPRESSION: No active cardiopulmonary disease. Electronically Signed   By: Lupita Raider M.D.   On: 08/08/2023 15:59    Family Communication: patient and family at bedside updated regarding current care plan.  Disposition: Status is: Inpatient Remains inpatient appropriate because: changed to inpatient as he has severe hypokalemia, need IV replacement.  Planned Discharge Destination: Home    Time spent: 45 minutes  Author: Marcelino Duster, MD 08/09/2023 11:37 AM  For on call review [email or web address].

## 2023-08-09 NOTE — Progress Notes (Signed)
Initial Nutrition Assessment  DOCUMENTATION CODES:   Morbid obesity  INTERVENTION:   -RD will follow for diet advancement and add supplements as appropriate -Boost Breeze po TID, each supplement provides 250 kcal and 9 grams of protein  -30 ml Prosource Plus BID, each supplement provides 100 kcals and 15 grams  -MVI with minerals daily  NUTRITION DIAGNOSIS:   Inadequate oral intake related to altered GI function as evidenced by NPO status.  GOAL:   Patient will meet greater than or equal to 90% of their needs  MONITOR:   PO intake, Supplement acceptance, Diet advancement  REASON FOR ASSESSMENT:   Consult Assessment of nutrition requirement/status  ASSESSMENT:   Pt with medical history significant for diabetes mellitus type 2, hypertension, obesity, sleep apnea presenting with leg swelling that is been going on for for the past 2 months PTA  Pt admitted with bilateral leg edema.   Reviewed I/O's: -500 ml x 24 hours  UOP: 900 ml x 24 hours   Per MD notes, pt with new onset CHF vs hepatic injury. Pt awaiting cardiology consult. Also questioning gallstones and intermittent abdominal pain- plan for MRCP and surgery vs GI consults.   Pt lying in bed at time of visit. Pt sleepy and deferred most of the interview to this wife at bedside. Pt has had intermittent abdominal pain since January. Wife shares that there is no particular pattern with his abdominal pain- he will be stable for a few weeks and then will have pain. Wife has noticed that he has eaten "more lightly" due to this. Pt works two jobs and usually consumes 2-3 meals per day (Breakfast: eggs, toast, and breakfast meat; Lunch: sandwich, chips, and fruit; Dinner: meat, starch, and vegetable). Pt complains that he is hungry, but understands rationale of NPO order.   Pt wife estimates that he has lost about 60# since January. Wife showed me pictures of pt prior to illness- pt with no fat or muscle depletions, but noted  decrease in stomach. Pt has also been feeling more weak and calling out of work when feeling poorly, but often "pushes through" to avoid chronic absenteeism. Per CareEverywhere, pt was 260# on 02/05/23. No wt loss noted since then. Per wife, she initially though pt may have lost weight due to ozempic, but pt states he has been on it for years.   Discussed importance of good meal and supplement intake to promote healing.   Lab Results  Component Value Date   HGBA1C 5.8 (H) 08/08/2023   PTA DM medications are 25 mg empagliflozin daily, 1 mg 50-1000 mg janumet daily, and 2.04-999 mg saxagliptin-metformin BID.   Labs reviewed: K: 2.1, CBGS: 89-113 (inpatient orders for glycemic control are ).    NUTRITION - FOCUSED PHYSICAL EXAM:  Flowsheet Row Most Recent Value  Orbital Region No depletion  Upper Arm Region No depletion  Thoracic and Lumbar Region No depletion  Buccal Region No depletion  Temple Region No depletion  Clavicle Bone Region No depletion  Clavicle and Acromion Bone Region No depletion  Scapular Bone Region No depletion  Dorsal Hand No depletion  Patellar Region No depletion  Anterior Thigh Region No depletion  Posterior Calf Region No depletion  Edema (RD Assessment) None  Hair Reviewed  Eyes Reviewed  Mouth Reviewed  Skin Reviewed  Nails Reviewed       Diet Order:   Diet Order             Diet clear liquid Fluid consistency: Thin  Diet effective now                   EDUCATION NEEDS:   Education needs have been addressed  Skin:  Skin Assessment: Reviewed RN Assessment  Last BM:  08/08/23  Height:   Ht Readings from Last 1 Encounters:  08/08/23 5\' 7"  (1.702 m)    Weight:   Wt Readings from Last 1 Encounters:  08/08/23 123.8 kg    Ideal Body Weight:  67.3 kg  BMI:  Body mass index is 42.76 kg/m.  Estimated Nutritional Needs:   Kcal:  1750-1950  Protein:  100-115 grams  Fluid:  > 1.7 L    Levada Schilling, RD, LDN, CDCES Registered  Dietitian II Certified Diabetes Care and Education Specialist Please refer to [facility] for RD and/or RD on-call/weekend/after hours pager

## 2023-08-09 NOTE — Progress Notes (Signed)
PHARMACY CONSULT NOTE - ELECTROLYTES  Pharmacy Consult for Electrolyte Monitoring and Replacement   Recent Labs: Height: 5\' 7"  (170.2 cm) Weight: 123.8 kg (273 lb) IBW/kg (Calculated) : 66.1 Estimated Creatinine Clearance: 125.4 mL/min (by C-G formula based on SCr of 0.73 mg/dL). Potassium (mmol/L)  Date Value  08/09/2023 2.1 (LL)   Magnesium (mg/dL)  Date Value  57/84/6962 2.0   Calcium (mg/dL)  Date Value  95/28/4132 7.9 (L)   Albumin (g/dL)  Date Value  44/12/270 3.6   Phosphorus (mg/dL)  Date Value  53/66/4403 2.7   Sodium (mmol/L)  Date Value  08/09/2023 144   Corrected Ca: 8.2 mg/dL  Assessment  Logan Harrell is a 57 y.o. male presenting with leg swelling, hypokalemia. PMH significant for DM2, HTN, HLD . Pharmacy has been consulted to monitor and replace electrolytes.  Diet: NPO  MIVF: none Pertinent medications: given lasix x 1,   PTA: lisinopril, janumet, jardiance, Korlym- can cause periph edema, hypokalemia  Goal of Therapy: Electrolytes WNL  Plan:  Will give 80 mEq KCl IV  Check K at 2100 Check BMP, Mg, Phos with AM labs  Thank you for allowing pharmacy to be a part of this patient's care.  Merryl Hacker, PharmD Clinical Pharmacist 08/09/2023 8:22 AM

## 2023-08-09 NOTE — Evaluation (Signed)
Physical Therapy Evaluation Patient Details Name: Logan Harrell MRN: [id number] DOB: May 08, 1966 Today's Date: 08/09/2023  History of Present Illness  57 y.o. male with medical history significant for diabetes mellitus type 2, hypertension, obesity, sleep apnea presenting with leg swelling that is been going on for for the past 2 months or so over the past 2 weeks.  Patient also presenting with intermittent abdominal pain since January that is worse sometimes with eating sometimes unrelated to eating patient's appetite has decreased due to this and is not able to eat a full meal because he just ends up having nausea and vomiting.  Otherwise no chest pain but patient did report some shortness of breath no fevers chills blurred vision speech or gait issues no falls  Clinical Impression  Patient admitted with the above. PTA, patient lives with wife and independent. Currently, functioning at independent level with no AD. Patient's K+ is 2.1 but via secure chat pt was cleared by MD for mobility within room during evaluation. No further skilled PT needs identified acutely. PT to complete orders at this time.       If plan is discharge home, recommend the following:     Can travel by private vehicle        Equipment Recommendations None recommended by PT  Recommendations for Other Services       Functional Status Assessment Patient has not had a recent decline in their functional status     Precautions / Restrictions Precautions Precautions: None      Mobility  Bed Mobility Overal bed mobility: Independent                  Transfers Overall transfer level: Independent Equipment used: None                    Ambulation/Gait Ambulation/Gait assistance: Independent Gait Distance (Feet): 25 Feet Assistive device: None Gait Pattern/deviations: WFL(Within Functional Limits)          Stairs            Wheelchair Mobility     Tilt Bed    Modified Rankin  (Stroke Patients Only)       Balance Overall balance assessment: Modified Independent                                           Pertinent Vitals/Pain Pain Assessment Pain Assessment: No/denies pain    Home Living Family/patient expects to be discharged to:: Private residence Living Arrangements: Spouse/significant other Available Help at Discharge: Family Type of Home: House Home Access: Ramped entrance;Stairs to enter   Secretary/administrator of Steps: 4   Home Layout: One level (1 step up insdie of home) Home Equipment: None      Prior Function Prior Level of Function : Independent/Modified Independent;Working/employed;Driving                     Extremity/Trunk Assessment   Upper Extremity Assessment Upper Extremity Assessment: Overall WFL for tasks assessed    Lower Extremity Assessment Lower Extremity Assessment: Overall WFL for tasks assessed       Communication      Cognition Arousal: Alert Behavior During Therapy: WFL for tasks assessed/performed Overall Cognitive Status: Within Functional Limits for tasks assessed  General Comments      Exercises     Assessment/Plan    PT Assessment Patient does not need any further PT services  PT Problem List         PT Treatment Interventions      PT Goals (Current goals can be found in the Care Plan section)  Acute Rehab PT Goals Patient Stated Goal: to go home and feel better PT Goal Formulation: All assessment and education complete, DC therapy    Frequency       Co-evaluation               AM-PAC PT "6 Clicks" Mobility  Outcome Measure Help needed turning from your back to your side while in a flat bed without using bedrails?: None Help needed moving from lying on your back to sitting on the side of a flat bed without using bedrails?: None Help needed moving to and from a bed to a chair (including a  wheelchair)?: None Help needed standing up from a chair using your arms (e.g., wheelchair or bedside chair)?: None Help needed to walk in hospital room?: None Help needed climbing 3-5 steps with a railing? : None 6 Click Score: 24    End of Session   Activity Tolerance: Patient tolerated treatment well Patient left: Other (comment);with call bell/phone within reach (standing in room with wife present) Nurse Communication: Mobility status PT Visit Diagnosis: Muscle weakness (generalized) (M62.81)    Time: 5409-8119 PT Time Calculation (min) (ACUTE ONLY): 12 min   Charges:   PT Evaluation $PT Eval Low Complexity: 1 Low   PT General Charges $$ ACUTE PT VISIT: 1 Visit         Maylon Peppers, PT, DPT Physical Therapist - [facility]  [facility]   Tyreon Frigon A Dayani Winbush 08/09/2023, 10:25 AM

## 2023-08-09 NOTE — Plan of Care (Signed)

## 2023-08-09 NOTE — Progress Notes (Signed)
Sent secure chat to Dr. Clide Dales and made MD aware that patient's BP still elevated after giving hydralazine. 173/93. Also patient is asking for something for anxiety/sleep for tonight. reports he was anxious/restless and couldn't sleep well last night. MD acknowledged all mentioned above and stated he will order something for tonight.

## 2023-08-09 NOTE — Progress Notes (Signed)
*  PRELIMINARY RESULTS* Echocardiogram 2D Echocardiogram has been performed.  Cristela Blue 08/09/2023, 9:45 AM

## 2023-08-09 NOTE — Progress Notes (Signed)
PT Cancellation Note  Patient Details Name: Logan Harrell MRN: [id number] DOB: 1966/01/24   Cancelled Treatment:    Reason Eval/Treat Not Completed: Medical issues which prohibited therapy (Most recent K+: 2.1, awaiting correction, currently outside recommended safe levels for exercise. Will continue to follow and inititate evaluation once medically optimized.)   8:05 AM, 08/09/23 Rosamaria Lints, PT, DPT Physical Therapist - [facility]  [phone] (ASCOM)    Hermena Swint C 08/09/2023, 8:05 AM

## 2023-08-09 NOTE — Progress Notes (Signed)
PHARMACY CONSULT NOTE - ELECTROLYTES  Pharmacy Consult for Electrolyte Monitoring and Replacement   Recent Labs: Height: 5\' 7"  (170.2 cm) Weight: 123.8 kg (273 lb) IBW/kg (Calculated) : 66.1 Estimated Creatinine Clearance: 125.4 mL/min (by C-G formula based on SCr of 0.73 mg/dL). Potassium (mmol/L)  Date Value  08/09/2023 2.4 (LL)   Magnesium (mg/dL)  Date Value  41/32/4401 2.0   Calcium (mg/dL)  Date Value  02/72/5366 7.9 (L)   Albumin (g/dL)  Date Value  44/02/4741 3.5   Phosphorus (mg/dL)  Date Value  59/56/3875 2.7   Sodium (mmol/L)  Date Value  08/09/2023 144   Assessment  Logan Harrell is a 57 y.o. male presenting with leg swelling, hypokalemia. PMH significant for DM2, HTN, HLD . Pharmacy has been consulted to monitor and replace electrolytes.  Diet: CLD  MIVF: N/A Pertinent medications: given lasix x 1,   PTA: lisinopril, janumet, jardiance, Korlym- can cause periph edema, hypokalemia  Goal of Therapy: Electrolytes WNL  Plan:  K+ 2.1 >> 2.4 s/p Kcl 10 mEq IV x 4 and Kcl 40 mEq PO x 1. Patient with four remaining doses of Kcl 10 mEq IV. Discussed with RN to complete that order Follow-up electrolytes with AM labs tomorrow   Thank you for allowing pharmacy to be a part of this patient's care.  Tressie Ellis 08/09/2023 7:44 PM

## 2023-08-10 ENCOUNTER — Inpatient Hospital Stay: Payer: BC Managed Care – PPO

## 2023-08-10 ENCOUNTER — Other Ambulatory Visit (HOSPITAL_COMMUNITY): Payer: Self-pay

## 2023-08-10 ENCOUNTER — Encounter: Payer: Self-pay | Admitting: Internal Medicine

## 2023-08-10 DIAGNOSIS — K8020 Calculus of gallbladder without cholecystitis without obstruction: Secondary | ICD-10-CM | POA: Diagnosis not present

## 2023-08-10 DIAGNOSIS — R7989 Other specified abnormal findings of blood chemistry: Secondary | ICD-10-CM | POA: Diagnosis not present

## 2023-08-10 DIAGNOSIS — E876 Hypokalemia: Secondary | ICD-10-CM | POA: Diagnosis not present

## 2023-08-10 DIAGNOSIS — R600 Localized edema: Secondary | ICD-10-CM | POA: Diagnosis not present

## 2023-08-10 LAB — HEPATIC FUNCTION PANEL
ALT: 118 U/L — ABNORMAL HIGH (ref 0–44)
AST: 125 U/L — ABNORMAL HIGH (ref 15–41)
Albumin: 3.4 g/dL — ABNORMAL LOW (ref 3.5–5.0)
Alkaline Phosphatase: 41 U/L (ref 38–126)
Bilirubin, Direct: 0.3 mg/dL — ABNORMAL HIGH (ref 0.0–0.2)
Indirect Bilirubin: 3 mg/dL — ABNORMAL HIGH (ref 0.3–0.9)
Total Bilirubin: 3.3 mg/dL — ABNORMAL HIGH (ref 0.3–1.2)
Total Protein: 6.3 g/dL — ABNORMAL LOW (ref 6.5–8.1)

## 2023-08-10 LAB — BASIC METABOLIC PANEL
Anion gap: 11 (ref 5–15)
BUN: 6 mg/dL (ref 6–20)
CO2: 29 mmol/L (ref 22–32)
Calcium: 8 mg/dL — ABNORMAL LOW (ref 8.9–10.3)
Chloride: 100 mmol/L (ref 98–111)
Creatinine, Ser: 0.7 mg/dL (ref 0.61–1.24)
GFR, Estimated: >60 mL/min (ref >60)
Glucose, Bld: 118 mg/dL — ABNORMAL HIGH (ref 70–99)
Potassium: 2.3 mmol/L — CL (ref 3.5–5.1)
Sodium: 140 mmol/L (ref 135–145)

## 2023-08-10 LAB — CBC
HCT: 39.7 % (ref 39.0–52.0)
Hemoglobin: 13.1 g/dL (ref 13.0–17.0)
MCH: 23.4 pg — ABNORMAL LOW (ref 26.0–34.0)
MCHC: 33 g/dL (ref 30.0–36.0)
MCV: 71 fL — ABNORMAL LOW (ref 80.0–100.0)
Platelets: 279 10*3/uL (ref 150–400)
RBC: 5.59 MIL/uL (ref 4.22–5.81)
RDW: 15 % (ref 11.5–15.5)
WBC: 6.7 10*3/uL (ref 4.0–10.5)
nRBC: 0 % (ref 0.0–0.2)

## 2023-08-10 LAB — PHOSPHORUS: Phosphorus: 3.1 mg/dL (ref 2.5–4.6)

## 2023-08-10 LAB — MAGNESIUM: Magnesium: 2 mg/dL (ref 1.7–2.4)

## 2023-08-10 LAB — POTASSIUM: Potassium: 2.5 mmol/L — CL (ref 3.5–5.1)

## 2023-08-10 LAB — GLUCOSE, CAPILLARY
Glucose-Capillary: 125 mg/dL — ABNORMAL HIGH (ref 70–99)
Glucose-Capillary: 148 mg/dL — ABNORMAL HIGH (ref 70–99)
Glucose-Capillary: 110 mg/dL — ABNORMAL HIGH (ref 70–99)

## 2023-08-10 MED ORDER — IOHEXOL 300 MG/ML  SOLN
100.0000 mL | Freq: Once | INTRAMUSCULAR | Status: AC | PRN
  Administered 2023-08-10: 100 mL via INTRAVENOUS

## 2023-08-10 MED ORDER — STERILE WATER FOR INJECTION IJ SOLN
INTRAMUSCULAR | Status: AC
  Filled 2023-08-10: qty 10

## 2023-08-10 MED ORDER — POTASSIUM CHLORIDE CRYS ER 20 MEQ PO TBCR
40.0000 meq | EXTENDED_RELEASE_TABLET | Freq: Two times a day (BID) | ORAL | Status: DC
  Administered 2023-08-10: 40 meq via ORAL
  Filled 2023-08-10: qty 2

## 2023-08-10 MED ORDER — LISINOPRIL 20 MG PO TABS
20.0000 mg | ORAL_TABLET | Freq: Once | ORAL | Status: AC
  Administered 2023-08-10: 20 mg via ORAL
  Filled 2023-08-10: qty 1

## 2023-08-10 MED ORDER — METOPROLOL TARTRATE 25 MG PO TABS
25.0000 mg | ORAL_TABLET | Freq: Two times a day (BID) | ORAL | Status: DC
  Administered 2023-08-10 – 2023-08-11 (×4): 25 mg via ORAL
  Filled 2023-08-10 (×4): qty 1

## 2023-08-10 MED ORDER — IOHEXOL 9 MG/ML PO SOLN
500.0000 mL | ORAL | Status: AC
  Administered 2023-08-10 (×2): 500 mL via ORAL

## 2023-08-10 MED ORDER — POTASSIUM CHLORIDE CRYS ER 20 MEQ PO TBCR
40.0000 meq | EXTENDED_RELEASE_TABLET | Freq: Once | ORAL | Status: AC
  Administered 2023-08-10: 40 meq via ORAL
  Filled 2023-08-10: qty 2

## 2023-08-10 MED ORDER — LISINOPRIL 20 MG PO TABS
40.0000 mg | ORAL_TABLET | Freq: Every day | ORAL | Status: DC
  Administered 2023-08-11 – 2023-08-15 (×5): 40 mg via ORAL
  Filled 2023-08-10: qty 2
  Filled 2023-08-10: qty 8
  Filled 2023-08-10: qty 2
  Filled 2023-08-10: qty 8
  Filled 2023-08-10: qty 2

## 2023-08-10 MED ORDER — POTASSIUM CHLORIDE 10 MEQ/100ML IV SOLN
10.0000 meq | INTRAVENOUS | Status: AC
  Administered 2023-08-10 (×3): 10 meq via INTRAVENOUS
  Filled 2023-08-10 (×3): qty 100

## 2023-08-10 NOTE — Progress Notes (Signed)
PT Cancellation Note  Patient Details Name: Logan Harrell MRN: [id number] DOB: 1966/02/05   Cancelled Treatment:    Reason Eval/Treat Not Completed: Other (comment): Per chart review patient evaluated by PT on 08/09/23 with no skilled PT needs identified, orders completed.  Per MD new PT order entered in error and ok for order to be completed.     Ovidio Hanger PT, DPT 08/10/23, 11:29 AM

## 2023-08-10 NOTE — Progress Notes (Signed)
PHARMACY CONSULT NOTE - ELECTROLYTES  Pharmacy Consult for Electrolyte Monitoring and Replacement   Recent Labs: Height: 5\' 7"  (170.2 cm) Weight: 113 kg (249 lb 1.9 oz) IBW/kg (Calculated) : 66.1 Estimated Creatinine Clearance: 119.4 mL/min (by C-G formula based on SCr of 0.7 mg/dL). Potassium (mmol/L)  Date Value  08/10/2023 2.3 (LL)   Magnesium (mg/dL)  Date Value  16/09/9603 2.0   Calcium (mg/dL)  Date Value  54/08/8118 8.0 (L)   Albumin (g/dL)  Date Value  14/78/2956 3.4 (L)   Phosphorus (mg/dL)  Date Value  21/30/8657 3.1   Sodium (mmol/L)  Date Value  08/10/2023 140   Assessment  Logan Harrell is a 57 y.o. male presenting with leg swelling, hypokalemia. PMH significant for DM2, HTN, HLD . Pharmacy has been consulted to monitor and replace electrolytes.  Diet: CLD, prosource 30 ml BID, boost TID,  MIVF: N/A Pertinent medications: given lasix x 1,  lisinopril 20 mg daily, spironoalctone 25 mg daily.   Korlym- can cause periph edema, hypokalemia  Goal of Therapy: Electrolytes WNL  Plan:  Medical team ordered Kcl 10 mEq IV x 3 and KCL 40 mEq PO BID.  F/uw with K+ level @1800 .  F/u with AM labs.   Thank you for allowing pharmacy to be a part of this patient's care.  Ronnald Ramp, PharmD, BCPS 08/10/2023 8:07 AM

## 2023-08-10 NOTE — Consult Note (Addendum)
GI Inpatient Consult Note  Reason for Consult: Abdominal pain, elevated LFTs, cholelithiasis   Attending Requesting Consult: Dr. Clide Dales  History of Present Illness: Logan Harrell is a 57 y.o. male seen for evaluation of abdominal pain, elevated LFTs, cholelithiasis at the request of Dr. Clide Dales. Patient has a PMH of T2DM, HTN, obesity, sleep apnea, hepatic steatosis, former cigarette smoking.    Patient presented to the [facility] ED 2 days ago for chief complaint of bilateral lower leg swelling, SOB, intermittent abdominal pain with vomiting . Upon presentation to the ED, vital signs were 173/92, 68, 17, 98.1 SpO2 96%. Labs were significant for K<2.0, gluc 97, Hgb 12.2, MCV 70, Plt 261, AST 216, ALT 158, Total Bilirubin 2.8,  direct bilirubin 0.3, indirect bilirubin 2.5, lipase 30, A1c 5.8, HIV, non reactive, Etoh <10, acetaminophen <10, Viral hepatitis non reactive, BNP 477. Imaging studies:  Duplex ultrasound reveals no evidence of DVT. [place]  RUQ reveals cholelithiasis without sonographic evidence of acute cholecystitis, normal  CBD.  Echocardiogram: LVEF 55 to 60%.  The left ventricle has no regional wall motion abnormalities. Left ventricular diastolic parameters are consistent with Grade II diastolic  dysfunction (pseudonormalization). No aortic stenosis is present.   Patient reports that he was diagnosed with fatty liver about 3 years ago by ultrasound.  He is unaware of any elevation in liver enzymes in the past.  He has never been told he has cirrhosis or liver disease.  No history of jaundice, pruritus, ascites. He does report heavy alcohol use 30 years ago described as a fifth of liquor and a 12 pack of beer weekly for about 10 years.  He has a 35 pack-year smoking history. He has never used smokeless tobacco. He reports current alcohol use of about 4.0 standard drinks of alcohol per week. No history of illicit drug use.   He has been taking Ozempic for many years.  He has experienced  intermittent episodes of diffuse tightness and discomfort upper abdomen followed by vomiting several times a month since January.  He vomits bile looking fluid immediately after eating at random and unpredictable intervals. Stomach feels "upset, sick". No  coffee ground emesis or  melena.  No heartburn, reflux, regurgitation, or dysphagia.   No specific triggers for these vomiting episodes.  Once he is sick, he has decreased appetite.No hx of gallbladder diseased, ulcer, or gastroparesis.  He reports a 50 pound weight loss from 320 lbs to 273 lbs since January due to the symptoms.  He does have history of constipation. He has a bowel movement every day without the use of stool softener or laxative.  He feels he has constipation because he feels incomplete evacuation at times.  Cologuard was -2 months ago.  He has noted no lower abdominal pain, cramping, or hematochezia.  Family History: Negative for colon cancer, liver disease, or other cancers. Past Surgical History: None      Last Colonoscopy: age 28 reports hemorrhoids and no polyps.  Cologuard: Most recent about 2 months ago and reports normal.  Last Endoscopy: none  Past Medical History:  Past Medical History:  Diagnosis Date   Arthritis    Diabetes mellitus without complication (HCC)    Hyperlipidemia    Hypertension    Sleep apnea     Problem List: Patient Active Problem List   Diagnosis Date Noted   Bilateral leg edema 08/08/2023   History of type 2 diabetes mellitus 08/08/2023   History of hypertension 08/08/2023   Elevated LFTs 08/08/2023  Cholelithiasis without obstruction 08/08/2023   Hypokalemia 08/08/2023   Elevated troponin 08/08/2023    Past Surgical History: History reviewed. No pertinent surgical history.  Allergies: No Known Allergies  Home Medications: Medications Prior to Admission  Medication Sig Dispense Refill Last Dose   aspirin EC 81 MG tablet Take 81 mg by mouth daily.   08/08/2023   empagliflozin  (JARDIANCE) 25 MG TABS tablet Take 25 mg by mouth daily.   08/08/2023   JANUMET XR 50-1000 MG TB24 Take 1 tablet by mouth daily.   08/08/2023   KORLYM 300 MG TABS Take 600 mg by mouth daily. 2 tabs (600 mg) in am ,1 tab (300mg ) in pm   08/08/2023   lisinopril (PRINIVIL,ZESTRIL) 20 MG tablet Take 20 mg by mouth daily.   08/08/2023   miFEPRIStone (KORLYM) 300 MG TABS Take 300 mg by mouth at bedtime. 2 tabs in am (600mg ) and 1 tab (300mg ) in pm      Multiple Vitamin (MULTIVITAMIN) tablet Take 1 tablet by mouth daily.   08/08/2023   rosuvastatin (CRESTOR) 10 MG tablet Take 10 mg by mouth daily.   08/08/2023   Semaglutide (OZEMPIC) 0.25 or 0.5 MG/DOSE SOPN Inject 1 mg into the skin once a week.    07/30/2023   vitamin C (ASCORBIC ACID) 500 MG tablet Take 500 mg by mouth daily.   08/08/2023   VITAMIN D, CHOLECALCIFEROL, PO Take 1-2 capsules by mouth daily.   08/08/2023   Saxagliptin-Metformin (KOMBIGLYZE XR) 2.04-999 MG TB24 Take 1 tablet by mouth 2 (two) times daily. (Patient not taking: Reported on 08/08/2023)   Not Taking   sildenafil (VIAGRA) 50 MG tablet  (Patient not taking: Reported on 08/08/2023)   Not Taking   spironolactone (ALDACTONE) 25 MG tablet Take 25 mg by mouth daily. (Patient not taking: Reported on 08/08/2023)   Not Taking   Testosterone Enanthate (XYOSTED) 75 MG/0.5ML SOAJ Inject 75 mcg into the skin once a week. (Patient not taking: Reported on 08/08/2023) 4 pen 2 Not Taking   TOUJEO SOLOSTAR 300 UNIT/ML SOPN  (Patient not taking: Reported on 08/08/2023)   Not Taking   Home medication reconciliation was completed with the patient.   Scheduled Inpatient Medications:    (feeding supplement) PROSource Plus  30 mL Oral BID BM   aspirin EC  81 mg Oral Daily   enoxaparin (LOVENOX) injection  0.5 mg/kg (Order-Specific) Subcutaneous Q24H   feeding supplement  1 Container Oral TID BM   insulin aspart  0-9 Units Subcutaneous TID WC   [START ON 08/11/2023] lisinopril  40 mg Oral Daily   melatonin  2.5  mg Oral QHS   metoprolol tartrate  25 mg Oral BID   multivitamin with minerals  1 tablet Oral Daily   pantoprazole (PROTONIX) IV  40 mg Intravenous Q12H   potassium chloride  40 mEq Oral BID   sodium chloride flush  3 mL Intravenous Q12H   spironolactone  25 mg Oral Daily    PRN Inpatient Medications:  acetaminophen **OR** acetaminophen, hydrALAZINE, LORazepam, metoprolol tartrate, morphine injection, ondansetron **OR** ondansetron (ZOFRAN) IV  Family History: family history includes Diabetes in his father and mother.  The patient's family history is negative for inflammatory bowel disorders, GI malignancy, or solid organ transplantation.  Social History:   reports that he has been smoking cigarettes. He has a 35 pack-year smoking history. He has never used smokeless tobacco. He reports current alcohol use of about 4.0 standard drinks of alcohol per week. He reports that  he does not use drugs. The patient denies ETOH, tobacco, or drug use.   Review of Systems: Constitutional: Weight is stable.  Eyes: No changes in vision. ENT: No oral lesions, sore throat.  GI: see HPI.  Heme/Lymph: No easy bruising.  CV: No chest pain.  GU: No hematuria.  Integumentary: No rashes.  Neuro: No headaches.  Psych: No depression/anxiety.  Endocrine: No heat/cold intolerance.  Allergic/Immunologic: No urticaria.  Resp: No cough, SOB.  Musculoskeletal: No joint swelling.    Physical Examination: BP (!) 188/96 (BP Location: Left Arm)   Pulse 80   Temp 98.8 F (37.1 C)   Resp 18   Ht 5\' 7"  (1.702 m)   Wt 113 kg   SpO2 95%   BMI 39.02 kg/m  Gen: NAD, alert and oriented x 4 HEENT: EOMI Neck: supple, no JVD or thyromegaly Chest: CTA bilaterally, no wheezes, crackles, or other adventitious sounds CV: RRR, no m/g/c/r Abd: soft, very mild tenderness RUQ, ND, +BS in all four quadrants; no HSM, guarding, rigidity, or rebound tenderness Ext: no edema, well perfused with 2+ pulses, Skin: no rash or  lesions noted Lymph: no LAD  Data: Lab Results  Component Value Date   WBC 6.7 08/10/2023   HGB 13.1 08/10/2023   HCT 39.7 08/10/2023   MCV 71.0 (L) 08/10/2023   PLT 279 08/10/2023   Recent Labs  Lab 08/08/23 1329 08/10/23 0428  HGB 12.3* 13.1   Lab Results  Component Value Date   NA 140 08/10/2023   K 2.3 (LL) 08/10/2023   CL 100 08/10/2023   CO2 29 08/10/2023   BUN 6 08/10/2023   CREATININE 0.70 08/10/2023   Lab Results  Component Value Date   ALT 118 (H) 08/10/2023   AST 125 (H) 08/10/2023   GGT 19 08/08/2023   ALKPHOS 41 08/10/2023   BILITOT 3.3 (H) 08/10/2023   No results for input(s): "APTT", "INR", "PTT" in the last 168 hours.  CLINICAL DATA:  Elevated transaminase level.   EXAM: ULTRASOUND ABDOMEN LIMITED RIGHT UPPER QUADRANT   COMPARISON:  None Available.   FINDINGS: Gallbladder:   Multiple small echogenic, mobile layering stones are seen measuring up to 5 mm. Negative sonographic Murphy's sign. Normal gallbladder wall thickness. No pericholecystic fluid.   Common bile duct:   Diameter: 5 mm, within normal limits. No intrahepatic or extrahepatic biliary ductal dilatation.   Liver:   No focal lesion identified. Within normal limits in parenchymal echogenicity. Portal vein is patent on color Doppler imaging with normal direction of blood flow towards the liver.   Other: None.   IMPRESSION: Cholelithiasis without sonographic evidence of acute cholecystitis.     Electronically Signed   By: Neita Garnet M.D.   On: 08/08/2023 17:58  Assessment/Plan: Logan Harrell is a 57 y.o. male is a 57 y.o. male seen for evaluation of abdominal pain, elevated LFTs, cholelithiasis at the request of Dr. Clide Dales. Patient has a PMH of T2DM, HTN, obesity, sleep apnea, hepatic steatosis, former cigarette smoking.   #  Intermittent upper abdominal  pain after eating with vomiting  #  Elevated LFTS down trending #  Cholelithiasis on [place] #  Profound unintended  weight loss- - No  jaundice  TB mildly elevated -His elevated indirect bilirubin suggesting fasting and stress.  -He has a direct bilirubin that is minimally elevated and stable not supporting choledocholithiasis.  The CBD was normal on US imaging. His GGT and alk phos are also normal.   - Normal Doppler flow  on ultrasound  - He reports a hx of fatty liver history and has normal liver parenchyma on current [place] after profound weight loss.  - Cannot rule out advanced liver disease, congested hepatopathy, or malignancy.   - Albumin and platelet counts normal on admission to suggest good synthetic function.  - Non reactive Hepatitis  ABC and HIV - Ozempic use DM- consider cause for weight loss and gastroparesis for pp N/V/pain   # Microcytosis - hgb normal - No melena or hematochezia noted by patient.  - Normal WBC and no signs of sepsis.  - Reports negative Cologuard a few months ago - making colon malignancy less likely - may be related to chronic disease- further studies were not collected such as iron panel - Reports negative screening colonoscopy at age 1 w exception of hemorrhoids  # bilateral lower leg edema with diastolic dysfunction. HTN- elevated  BP  Elevated BNP 477 troponin 82.to consider congestive hepatopathy  # Severe hypokalemia: Possible Mifepristone adverse effect.  Korlym held Lipase normal Primary team to manage potassium supplementation   Recommendations: Obtain CT chest/abd/pelvis W and WO contrast today  If CT is negative consider EGD tomorrow. Check on last OZEMPIC dose. Note he is receiving Lovenox 60 mg subcutaneous every 24 hours Zofran 4 mg every 6 hours as needed Protonix 40 mg IV twice daily  Avoid Hepatotoxic drugs Holding statin Pain management, anti emetics, and supportive care as per primary team Consider AM labs: fasting ferritin and iron panel, PT/INR, hepatic panel   Further GI recommendations pending.  This case was discussed with Dr. Timothy Lasso  in collaboration of care.   Thank you for the consult. Please call with questions or concerns.  Amedeo Kinsman, ANP [facility] [phone]

## 2023-08-10 NOTE — Progress Notes (Signed)
Progress Note   Patient: Logan Harrell ZOX:[id number] DOB: 1966-11-13 DOA: 08/08/2023     1 DOS: the patient was seen and examined on 08/10/2023   Brief hospital course: Syeed Rathgeber is a 57 y.o. male with medical history significant for diabetes mellitus type 2, hypertension, obesity, sleep apnea presenting with leg swelling that is been going on for for the past 2 months or so over the past 2 weeks.  Patient also presenting with intermittent abdominal pain since January that is worse sometimes with eating sometimes unrelated to eating patient's appetite has decreased due to this and is not able to eat a full meal because of nausea and vomiting.   Assessment and Plan: * Bilateral leg edema Unlikely CHF. Encouraged leg elevation. Echo showed normal EF. Avoid hepatotoxic drugs. Strict I/O and daily weights.   Elevated troponin EKG shows sinus rhythm 62 PR interval 154 QRS 100 LVH with a tall R wave in lead aVL QTc prolonged at 544. Mifepristone can cause elevated Qtc, will hold it. BNP of 477 and troponin of 82.  Echo unremarkable, he reports no chest pain. Continue to follow and replace electrolytes.  Severe Hypokalemia Due to GI losses vs Mifepristone adverse effect. Continue to replace potassium per Pharmacy protocol. Korlym held. ? Conn syndrome (primary aldosteronism) high BP, low K. Check aldosterone level.  Cholelithiasis without obstruction Elevated LFTs RUQ uls showed choledocholithiasis, no cholecystitis. GGT wnl. Non obstructive etiology. Intermittent abdominal pain, nausea and vomiting. Clear liquid diet and advance as tolerated. Acute hep panel negative. Hold statin. Continue to trend LFT. GI evaluation called.  Uncontrolled hypertension Increased lisinopril to 40 and PRN hydralazine.  Added metoprolol 50 bid.  History of type 2 diabetes mellitus Accucheks, sliding scale insulin. Hypoglycemia protocol.     Subjective: Patient is seen and examined today  morning. He is lying in bed, feels better today, able to tolerate liquids. No abdominal pain, got out of bed.  Physical Exam: Vitals:   08/10/23 0622 08/10/23 0653 08/10/23 0744 08/10/23 1157  BP: (!) 201/100 (!) 188/82 (!) 181/93 (!) 188/96  Pulse: 69 66 75 80  Resp:    18  Temp:    98.8 F (37.1 C)  TempSrc:      SpO2:   95% 95%  Weight:      Height:       General -  Middle aged African American male, no distress HEENT - PERRLA, EOMI, atraumatic head, non tender sinuses. Lung - Clear, rales, rhonchi, wheezes. Heart - S1, S2 heard, no murmurs, rubs, trace left > right pedal edema Abdomen - soft, non tender, obese, bowel sounds good. Neuro - Alert, awake and oriented x 3, non focal exam. Skin - Warm and dry. Data Reviewed:     Latest Ref Rng & Units 08/10/2023    4:28 AM 08/08/2023    1:29 PM  CBC  WBC 4.0 - 10.5 K/uL 6.7  6.5   Hemoglobin 13.0 - 17.0 g/dL 81.1  91.4   Hematocrit 39.0 - 52.0 % 39.7  37.3   Platelets 150 - 400 K/uL 279  261        Latest Ref Rng & Units 08/10/2023    4:28 AM 08/09/2023    6:36 PM 08/09/2023    4:45 AM  BMP  Glucose 70 - 99 mg/dL 782   956   BUN 6 - 20 mg/dL 6   6   Creatinine 2.13 - 1.24 mg/dL 0.86   5.78   Sodium 469 - 145  mmol/L 140   144   Potassium 3.5 - 5.1 mmol/L 2.3  2.4  2.1   Chloride 98 - 111 mmol/L 100   103   CO2 22 - 32 mmol/L 29   29   Calcium 8.9 - 10.3 mg/dL 8.0   7.9    ECHOCARDIOGRAM COMPLETE  Result Date: 08/09/2023    ECHOCARDIOGRAM REPORT   Patient Name:   KALIL BONFANTE Date of Exam: 08/09/2023 Medical Rec #:  [id number]    Height:       67.0 in Accession #:    [id number]   Weight:       273.0 lb Date of Birth:  03/22/1966    BSA:          2.308 m Patient Age:    57 years     BP:           165/83 mmHg Patient Gender: M            HR:           69 bpm. Exam Location:  [facility] Procedure: 2D Echo, Cardiac Doppler and Color Doppler Indications:     Shortness of breath                  Bilateral leg edema  History:          Patient has no prior history of Echocardiogram examinations.                  Risk Factors:Diabetes, Hypertension, Dyslipidemia and Sleep                  Apnea.  Sonographer:     Cristela Blue Referring Phys:  [id number] Eliezer Mccoy PATEL Diagnosing Phys: Yvonne Kendall MD IMPRESSIONS  1. Left ventricular ejection fraction, by estimation, is 55 to 60%. The left ventricle has normal function. The left ventricle has no regional wall motion abnormalities. Left ventricular diastolic parameters are consistent with Grade II diastolic dysfunction (pseudonormalization).  2. Right ventricular systolic function is normal. The right ventricular size is mildly enlarged. Tricuspid regurgitation signal is inadequate for assessing PA pressure.  3. The mitral valve is normal in structure. Trivial mitral valve regurgitation. No evidence of mitral stenosis.  4. The aortic valve is tricuspid. Aortic valve regurgitation is not visualized. No aortic stenosis is present. FINDINGS  Left Ventricle: Left ventricular ejection fraction, by estimation, is 55 to 60%. The left ventricle has normal function. The left ventricle has no regional wall motion abnormalities. The left ventricular internal cavity size was normal in size. There is  no left ventricular hypertrophy. Left ventricular diastolic parameters are consistent with Grade II diastolic dysfunction (pseudonormalization). Right Ventricle: The right ventricular size is mildly enlarged. No increase in right ventricular wall thickness. Right ventricular systolic function is normal. Tricuspid regurgitation signal is inadequate for assessing PA pressure. Left Atrium: Left atrial size was normal in size. Right Atrium: Right atrial size was normal in size. Pericardium: Trivial pericardial effusion is present. Mitral Valve: The mitral valve is normal in structure. Trivial mitral valve regurgitation. No evidence of mitral valve stenosis. MV peak gradient, 3.9 mmHg. The mean mitral valve gradient is 1.0  mmHg. Tricuspid Valve: The tricuspid valve is normal in structure. Tricuspid valve regurgitation is mild. Aortic Valve: The aortic valve is tricuspid. Aortic valve regurgitation is not visualized. No aortic stenosis is present. Aortic valve mean gradient measures 2.0 mmHg. Aortic valve peak gradient measures 4.4 mmHg. Aortic valve area, by VTI measures 3.45  cm. Pulmonic Valve: The pulmonic valve was normal in structure. Pulmonic valve regurgitation is trivial. No evidence of pulmonic stenosis. Aorta: The aortic root is normal in size and structure. Venous: The inferior vena cava was not well visualized. IAS/Shunts: The interatrial septum was not well visualized.  LEFT VENTRICLE PLAX 2D LVIDd:         4.80 cm   Diastology LVIDs:         3.30 cm   LV e' medial:    6.09 cm/s LV PW:         1.01 cm   LV E/e' medial:  12.4 LV IVS:        0.87 cm   LV e' lateral:   6.09 cm/s LVOT diam:     2.00 cm   LV E/e' lateral: 12.4 LV SV:         74 LV SV Index:   32 LVOT Area:     3.14 cm  RIGHT VENTRICLE RV Basal diam:  4.60 cm RV Mid diam:    3.80 cm RV S prime:     17.60 cm/s TAPSE (M-mode): 2.8 cm LEFT ATRIUM             Index        RIGHT ATRIUM           Index LA diam:        5.00 cm 2.17 cm/m   RA Area:     12.30 cm LA Vol (A2C):   61.8 ml 26.77 ml/m  RA Volume:   23.00 ml  9.96 ml/m LA Vol (A4C):   64.3 ml 27.86 ml/m LA Biplane Vol: 65.9 ml 28.55 ml/m  AORTIC VALVE AV Area (Vmax):    3.20 cm AV Area (Vmean):   3.08 cm AV Area (VTI):     3.45 cm AV Vmax:           105.00 cm/s AV Vmean:          70.100 cm/s AV VTI:            0.213 m AV Peak Grad:      4.4 mmHg AV Mean Grad:      2.0 mmHg LVOT Vmax:         107.00 cm/s LVOT Vmean:        68.800 cm/s LVOT VTI:          0.234 m LVOT/AV VTI ratio: 1.10  AORTA Ao Root diam: 3.24 cm MITRAL VALVE MV Area (PHT): 3.24 cm    SHUNTS MV Area VTI:   2.74 cm    Systemic VTI:  0.23 m MV Peak grad:  3.9 mmHg    Systemic Diam: 2.00 cm MV Mean grad:  1.0 mmHg MV Vmax:       0.98  m/s MV Vmean:      53.3 cm/s MV Decel Time: 234 msec MV E velocity: 75.50 cm/s MV A velocity: 49.80 cm/s MV E/A ratio:  1.52 Cristal [place] MD Electronically signed by Yvonne Kendall MD Signature Date/Time: 08/09/2023/3:10:05 PM    Final     Family Communication: patient and wife at bedside updated regarding current care plan.  Disposition: Status is: Inpatient Remains inpatient appropriate because: severe hypokalemia, need IV replacement, high BP  Planned Discharge Destination: Home    Time spent: 44 minutes  Author: Marcelino Duster, MD 08/10/2023 5:54 PM  For on call review [email or web address].

## 2023-08-10 NOTE — TOC Benefit Eligibility Note (Signed)
Patient Product/process development scientist completed.    The patient is insured through [employer] [facility]. Patient has [employer], may use a copay card, and/or apply for patient assistance if available.    Ran test claim for Entresto 24-26 mg and the current 30 day co-pay is $50.00.  Ran test claim for Farxiga 10 mg and the current 30 day co-pay is $50.00.  Ran test claim for Jardiance 10 mg and the current 30 day co-pay is $50.00.   This test claim was processed through [facility]- copay amounts may vary at other pharmacies due to pharmacy/plan contracts, or as the patient moves through the different stages of their insurance plan.     Roland Earl, CPHT Pharmacy Technician III Certified Patient Advocate [facility] Patient Advocate Team Direct Number: [phone]  Fax: [phone]

## 2023-08-10 NOTE — Progress Notes (Signed)
Date and time results received: 08/10/23 1830   Critical Value: potassium 2.5  Name of Provider Notified: [name]

## 2023-08-10 NOTE — TOC Progression Note (Signed)
Transition of Care [facility]) - Progression Note    Patient Details  Name: Logan Harrell MRN: [id number] Date of Birth: 12-11-1966  [facility] Care [facility]) CM/SW Contact  Truddie Hidden, RN Phone Number: 08/10/2023, 11:56 AM  Clinical Narrative:    TOC continuing ongoing assessments for needs that may arise and changes in discharge plan.        Expected Discharge Plan and Services                                               Social Determinants of Health (SDOH) Interventions SDOH Screenings   Food Insecurity: No Food Insecurity (08/08/2023)  Housing: Low Risk  (08/08/2023)  Transportation Needs: No Transportation Needs (08/08/2023)  Utilities: Not At Risk (08/08/2023)  Tobacco Use: High Risk (08/08/2023)    Readmission Risk Interventions     No data to display

## 2023-08-10 NOTE — Progress Notes (Signed)
PHARMACY CONSULT NOTE - ELECTROLYTES  Pharmacy Consult for Electrolyte Monitoring and Replacement   Recent Labs: Height: 5\' 7"  (170.2 cm) Weight: 113 kg (249 lb 1.9 oz) IBW/kg (Calculated) : 66.1 Estimated Creatinine Clearance: 119.4 mL/min (by C-G formula based on SCr of 0.7 mg/dL). Potassium (mmol/L)  Date Value  08/10/2023 2.5 (LL)   Magnesium (mg/dL)  Date Value  16/09/9603 2.0   Calcium (mg/dL)  Date Value  54/08/8118 8.0 (L)   Albumin (g/dL)  Date Value  14/78/2956 3.4 (L)   Phosphorus (mg/dL)  Date Value  21/30/8657 3.1   Sodium (mmol/L)  Date Value  08/10/2023 140   Assessment  Logan Harrell is a 57 y.o. male presenting with leg swelling, hypokalemia. PMH significant for DM2, HTN, HLD . Pharmacy has been consulted to monitor and replace electrolytes.  Diet: CLD  MIVF: N/A Pertinent medications: Aldactone 25 mg daily, Kcl 40 mEq BID scheduled  Goal of Therapy: Electrolytes WNL  Plan:  --Repeat K+ is 2.5 from 2.3 this AM. Patient is scheduled for Kcl 40 mEq PO at 2200. Will give one additional dose of Kcl 40 mEq PO now --Follow-up electrolytes with AM labs tomorrow  Thank you for allowing pharmacy to be a part of this patient's care.  Tressie Ellis 08/10/2023 6:51 PM

## 2023-08-10 NOTE — Progress Notes (Signed)
[facility] HF Stewardship  PCP: Patient, No Pcp Per  PCP-Cardiologist: None  HPI: Logan Harrell is a 57 y.o. male with diabetes mellitus type 2, hypertension, obesity, sleep apnea  who presented with bilateral leg edema. Echocardiogram on 08/09/23 showed LVEF of 55-60% with grade II diastolic dysfunction.   Pertinent Lab Values: Creatinine, Ser  Date Value Ref Range Status  08/10/2023 0.70 0.61 - 1.24 mg/dL Final   BUN  Date Value Ref Range Status  08/10/2023 6 6 - 20 mg/dL Final   Potassium  Date Value Ref Range Status  08/10/2023 2.3 (LL) 3.5 - 5.1 mmol/L Final    Comment:    CRITICAL RESULT CALLED TO, READ BACK BY AND VERIFIED WITH COURTNEY REAVES@[id number]  08/10/23 RH    Sodium  Date Value Ref Range Status  08/10/2023 140 135 - 145 mmol/L Final   B Natriuretic Peptide  Date Value Ref Range Status  08/08/2023 477.1 (H) 0.0 - 100.0 pg/mL Final    Comment:    Performed at [facility], [address] [place], [place] [address]   Magnesium  Date Value Ref Range Status  08/10/2023 2.0 1.7 - 2.4 mg/dL Final    Comment:    Performed at [facility], [address] [place], [place] [address]   Hgb A1c MFr Bld  Date Value Ref Range Status  08/08/2023 5.8 (H) 4.8 - 5.6 % Final    Comment:    (NOTE) Pre diabetes:          5.7%-6.4%  Diabetes:              >6.4%  Glycemic control for   <7.0% adults with diabetes     Vital Signs: Temp:  [98.6 F (37 C)-99.2 F (37.3 C)] 98.8 F (37.1 C) (08/21 1157) Pulse Rate:  [64-80] 80 (08/21 1157) Cardiac Rhythm: Normal sinus rhythm (08/21 0708) Resp:  [14-20] 18 (08/21 1157) BP: (161-201)/(77-100) 188/96 (08/21 1157) SpO2:  [94 %-100 %] 95 % (08/21 1157) Weight:  [113 kg (249 lb 1.9 oz)] 113 kg (249 lb 1.9 oz) (08/21 0416)   Intake/Output Summary (Last 24 hours) at 08/10/2023 1200 Last data filed at 08/10/2023 1118 Gross per 24 hour  Intake 2500 ml  Output 4050 ml  Net -1550 ml    Current Inpatient  Medications:      Angiotensin-Converting Enzyme Inhibitor (ACEi) or Angiotensin II Receptor Blocker (ARB): Lisinopril 20 mg daily     Sodium-glucose Cotransporter-2 Inhibitors (SGLT2i): Jardiance 25 mg daily   Prior to admission HF Medications:            Sodium-glucose Cotransporter-2 Inhibitors (SGLT2i): Jardiance 25 mg daily   Assessment: 1. Diastolic heart failure (LVEF 55-60%), due to unknown etiology. NYHA class II symptoms.  -BP markedly elevated with systolic 180-200 mmHg. Moderate urine output. Not currently on diuretic. -Patient on first line HFpEF GDMT at home of Jardiance. Okay to resume inpatient. Given marked hypertension, can consider transition to [facility] if uncontrolled on lisinopril. -LLE improved. No shortness of breath today.   Plan: 1) Medication changes recommended at this time: -Increase lisinopril to 40 mg daily (discussed with MD) -Maintain strict I/Os, daily weights, Mg >2, and K >4.  2) Patient assistance: -Copay for Entresto and Jardiance are $50, but will work with copay cards.  3) Education: - Patient has been educated on current HF medications and potential additions to HF medication regimen - Patient verbalizes understanding that over the next few months, these medication doses may change and more  medications may be added to optimize HF regimen - Patient has been educated on basic disease state pathophysiology and goals of therapy  Medication Assistance / Insurance Benefits Check:  Does the patient have prescription insurance? Prescription Insurance: Commercial ([employer])  Type of insurance plan:   Does the patient qualify for medication assistance through manufacturers or grants? No  Outpatient Pharmacy:  [facility]     Thank you for involving pharmacy in this patient's care.  Enos Fling, PharmD, BCPS Phone - [phone] Clinical Pharmacist 08/10/2023 12:02 PM

## 2023-08-11 ENCOUNTER — Encounter
Admission: EM | Disposition: A | Discharge status: Home / Self Care | Payer: Self-pay | Source: Home / Self Care | Attending: Internal Medicine

## 2023-08-11 ENCOUNTER — Encounter: Payer: Self-pay | Admitting: Registered Nurse

## 2023-08-11 ENCOUNTER — Inpatient Hospital Stay: Payer: BC Managed Care – PPO

## 2023-08-11 DIAGNOSIS — I10 Essential (primary) hypertension: Secondary | ICD-10-CM

## 2023-08-11 DIAGNOSIS — K8020 Calculus of gallbladder without cholecystitis without obstruction: Secondary | ICD-10-CM | POA: Diagnosis not present

## 2023-08-11 DIAGNOSIS — E876 Hypokalemia: Secondary | ICD-10-CM | POA: Diagnosis not present

## 2023-08-11 DIAGNOSIS — A419 Sepsis, unspecified organism: Secondary | ICD-10-CM

## 2023-08-11 DIAGNOSIS — J9601 Acute respiratory failure with hypoxia: Secondary | ICD-10-CM

## 2023-08-11 DIAGNOSIS — R6520 Severe sepsis without septic shock: Secondary | ICD-10-CM

## 2023-08-11 DIAGNOSIS — E2609 Other primary hyperaldosteronism: Secondary | ICD-10-CM | POA: Insufficient documentation

## 2023-08-11 DIAGNOSIS — R7989 Other specified abnormal findings of blood chemistry: Secondary | ICD-10-CM | POA: Diagnosis not present

## 2023-08-11 DIAGNOSIS — E669 Obesity, unspecified: Secondary | ICD-10-CM

## 2023-08-11 DIAGNOSIS — R600 Localized edema: Secondary | ICD-10-CM | POA: Diagnosis not present

## 2023-08-11 LAB — COMPREHENSIVE METABOLIC PANEL
ALT: 121 U/L — ABNORMAL HIGH (ref 0–44)
ALT: 136 U/L — ABNORMAL HIGH (ref 0–44)
AST: 148 U/L — ABNORMAL HIGH (ref 15–41)
AST: 234 U/L — ABNORMAL HIGH (ref 15–41)
Albumin: 3.6 g/dL (ref 3.5–5.0)
Albumin: 3.8 g/dL (ref 3.5–5.0)
Alkaline Phosphatase: 40 U/L (ref 38–126)
Alkaline Phosphatase: 42 U/L (ref 38–126)
Anion gap: 12 (ref 5–15)
Anion gap: 14 (ref 5–15)
BUN: 6 mg/dL (ref 6–20)
BUN: 7 mg/dL (ref 6–20)
CO2: 28 mmol/L (ref 22–32)
CO2: 26 mmol/L (ref 22–32)
Calcium: 8.2 mg/dL — ABNORMAL LOW (ref 8.9–10.3)
Calcium: 8.1 mg/dL — ABNORMAL LOW (ref 8.9–10.3)
Chloride: 97 mmol/L — ABNORMAL LOW (ref 98–111)
Chloride: 98 mmol/L (ref 98–111)
Creatinine, Ser: 0.75 mg/dL (ref 0.61–1.24)
Creatinine, Ser: 0.85 mg/dL (ref 0.61–1.24)
GFR, Estimated: >60 mL/min (ref >60)
GFR, Estimated: >60 mL/min (ref >60)
Glucose, Bld: 114 mg/dL — ABNORMAL HIGH (ref 70–99)
Glucose, Bld: 140 mg/dL — ABNORMAL HIGH (ref 70–99)
Potassium: 2.4 mmol/L — CL (ref 3.5–5.1)
Potassium: 2.5 mmol/L — CL (ref 3.5–5.1)
Sodium: 137 mmol/L (ref 135–145)
Sodium: 138 mmol/L (ref 135–145)
Total Bilirubin: 3.7 mg/dL — ABNORMAL HIGH (ref 0.3–1.2)
Total Bilirubin: 3.9 mg/dL — ABNORMAL HIGH (ref 0.3–1.2)
Total Protein: 6.8 g/dL (ref 6.5–8.1)
Total Protein: 6.9 g/dL (ref 6.5–8.1)

## 2023-08-11 LAB — CBC
HCT: 38.6 % — ABNORMAL LOW (ref 39.0–52.0)
Hemoglobin: 12.5 g/dL — ABNORMAL LOW (ref 13.0–17.0)
MCH: 22.6 pg — ABNORMAL LOW (ref 26.0–34.0)
MCHC: 32.4 g/dL (ref 30.0–36.0)
MCV: 69.8 fL — ABNORMAL LOW (ref 80.0–100.0)
Platelets: 251 10*3/uL (ref 150–400)
RBC: 5.53 MIL/uL (ref 4.22–5.81)
RDW: 14.7 % (ref 11.5–15.5)
WBC: 5.4 10*3/uL (ref 4.0–10.5)
nRBC: 0 % (ref 0.0–0.2)

## 2023-08-11 LAB — CBC WITH DIFFERENTIAL/PLATELET
Abs Immature Granulocytes: 0.03 10*3/uL (ref 0.00–0.07)
Basophils Absolute: 0 10*3/uL (ref 0.0–0.1)
Basophils Relative: 0 %
Eosinophils Absolute: 0 10*3/uL (ref 0.0–0.5)
Eosinophils Relative: 0 %
HCT: 40.1 % (ref 39.0–52.0)
Hemoglobin: 13.3 g/dL (ref 13.0–17.0)
Immature Granulocytes: 0 %
Lymphocytes Relative: 6 %
Lymphs Abs: 0.5 10*3/uL — ABNORMAL LOW (ref 0.7–4.0)
MCH: 23.3 pg — ABNORMAL LOW (ref 26.0–34.0)
MCHC: 33.2 g/dL (ref 30.0–36.0)
MCV: 70.4 fL — ABNORMAL LOW (ref 80.0–100.0)
Monocytes Absolute: 0.5 10*3/uL (ref 0.1–1.0)
Monocytes Relative: 6 %
Neutro Abs: 6.7 10*3/uL (ref 1.7–7.7)
Neutrophils Relative %: 88 %
Platelets: 244 10*3/uL (ref 150–400)
RBC: 5.7 MIL/uL (ref 4.22–5.81)
RDW: 15 % (ref 11.5–15.5)
WBC: 7.7 10*3/uL (ref 4.0–10.5)
nRBC: 0 % (ref 0.0–0.2)

## 2023-08-11 LAB — PROTIME-INR
INR: 1.1 (ref 0.8–1.2)
Prothrombin Time: 14.5 s (ref 11.4–15.2)

## 2023-08-11 LAB — URINALYSIS, ROUTINE W REFLEX MICROSCOPIC
Bilirubin Urine: NEGATIVE
Glucose, UA: >=500 mg/dL — AB
Ketones, ur: 20 mg/dL — AB
Leukocytes,Ua: NEGATIVE
Nitrite: NEGATIVE
Protein, ur: 100 mg/dL — AB
Specific Gravity, Urine: 1.014 (ref 1.005–1.030)
pH: 5 (ref 5.0–8.0)

## 2023-08-11 LAB — LACTIC ACID, PLASMA
Lactic Acid, Venous: 1.5 mmol/L (ref 0.5–1.9)
Lactic Acid, Venous: 2.2 mmol/L (ref 0.5–1.9)

## 2023-08-11 LAB — POTASSIUM: Potassium: 2.7 mmol/L — CL (ref 3.5–5.1)

## 2023-08-11 LAB — GLUCOSE, CAPILLARY
Glucose-Capillary: 127 mg/dL — ABNORMAL HIGH (ref 70–99)
Glucose-Capillary: 131 mg/dL — ABNORMAL HIGH (ref 70–99)
Glucose-Capillary: 127 mg/dL — ABNORMAL HIGH (ref 70–99)

## 2023-08-11 LAB — MRSA NEXT GEN BY PCR, NASAL: MRSA by PCR Next Gen: NOT DETECTED

## 2023-08-11 SURGERY — ESOPHAGOGASTRODUODENOSCOPY (EGD) WITH PROPOFOL
Anesthesia: General

## 2023-08-11 MED ORDER — LACTATED RINGERS IV BOLUS (SEPSIS)
1000.0000 mL | Freq: Once | INTRAVENOUS | Status: AC
  Administered 2023-08-11: 1000 mL via INTRAVENOUS

## 2023-08-11 MED ORDER — SODIUM CHLORIDE 0.9 % IV SOLN
2.0000 g | Freq: Once | INTRAVENOUS | Status: AC
  Administered 2023-08-11: 2 g via INTRAVENOUS
  Filled 2023-08-11: qty 12.5

## 2023-08-11 MED ORDER — PNEUMOCOCCAL VAC POLYVALENT 25 MCG/0.5ML IJ INJ
0.5000 mL | INJECTION | INTRAMUSCULAR | Status: AC
  Administered 2023-08-12: 0.5 mL via INTRAMUSCULAR
  Filled 2023-08-11: qty 0.5

## 2023-08-11 MED ORDER — SPIRONOLACTONE 25 MG PO TABS
50.0000 mg | ORAL_TABLET | Freq: Every day | ORAL | Status: DC
  Administered 2023-08-12: 50 mg via ORAL
  Filled 2023-08-11: qty 2

## 2023-08-11 MED ORDER — POTASSIUM CHLORIDE 10 MEQ/100ML IV SOLN: 10.0000 meq | INTRAVENOUS | Status: DC

## 2023-08-11 MED ORDER — LACTATED RINGERS IV SOLN
150.0000 mL/h | INTRAVENOUS | Status: AC
  Administered 2023-08-11 – 2023-08-12 (×3): 150 mL/h via INTRAVENOUS

## 2023-08-11 MED ORDER — POTASSIUM CHLORIDE 10 MEQ/100ML IV SOLN
10.0000 meq | INTRAVENOUS | Status: AC
  Administered 2023-08-11 – 2023-08-12 (×2): 10 meq via INTRAVENOUS
  Filled 2023-08-11: qty 100

## 2023-08-11 MED ORDER — LACTATED RINGERS IV BOLUS (SEPSIS)
200.0000 mL | Freq: Once | INTRAVENOUS | Status: AC
  Administered 2023-08-11: 200 mL via INTRAVENOUS

## 2023-08-11 MED ORDER — SODIUM CHLORIDE 0.9 % IV SOLN
2.0000 g | Freq: Three times a day (TID) | INTRAVENOUS | Status: DC
  Administered 2023-08-12 (×2): 2 g via INTRAVENOUS
  Filled 2023-08-11 (×3): qty 12.5

## 2023-08-11 MED ORDER — METOPROLOL TARTRATE 50 MG PO TABS
50.0000 mg | ORAL_TABLET | Freq: Two times a day (BID) | ORAL | Status: DC
  Administered 2023-08-12 (×2): 50 mg via ORAL
  Filled 2023-08-11 (×2): qty 1

## 2023-08-11 MED ORDER — CHLORHEXIDINE GLUCONATE CLOTH 2 % EX PADS
6.0000 | MEDICATED_PAD | Freq: Every day | CUTANEOUS | Status: DC
  Administered 2023-08-11 – 2023-08-15 (×4): 6 via TOPICAL

## 2023-08-11 MED ORDER — POTASSIUM CHLORIDE 10 MEQ/100ML IV SOLN
10.0000 meq | INTRAVENOUS | Status: DC
  Administered 2023-08-11: 10 meq via INTRAVENOUS
  Filled 2023-08-11 (×5): qty 100

## 2023-08-11 MED ORDER — MORPHINE SULFATE (PF) 4 MG/ML IV SOLN
3.0000 mg | Freq: Once | INTRAVENOUS | Status: AC
  Administered 2023-08-11: 3 mg via INTRAVENOUS
  Filled 2023-08-11: qty 1

## 2023-08-11 MED ORDER — VANCOMYCIN HCL 2000 MG/400ML IV SOLN
2000.0000 mg | Freq: Once | INTRAVENOUS | Status: AC
  Administered 2023-08-11: 2000 mg via INTRAVENOUS
  Filled 2023-08-11: qty 400

## 2023-08-11 MED ORDER — POTASSIUM CHLORIDE 10 MEQ/100ML IV SOLN
10.0000 meq | INTRAVENOUS | Status: AC
  Administered 2023-08-11 (×4): 10 meq via INTRAVENOUS
  Filled 2023-08-11 (×4): qty 100

## 2023-08-11 MED ORDER — LORAZEPAM 2 MG/ML IJ SOLN: 1.0000 mg | Freq: Once | INTRAMUSCULAR | Status: DC

## 2023-08-11 MED ORDER — VANCOMYCIN HCL IN DEXTROSE 1-5 GM/200ML-% IV SOLN
1000.0000 mg | Freq: Once | INTRAVENOUS | Status: DC
  Filled 2023-08-11: qty 200

## 2023-08-11 MED ORDER — TECHNETIUM TC 99M MEBROFENIN IV KIT
7.5000 | PACK | Freq: Once | INTRAVENOUS | Status: AC | PRN
  Administered 2023-08-11: 7.67 via INTRAVENOUS

## 2023-08-11 MED ORDER — POTASSIUM CHLORIDE CRYS ER 20 MEQ PO TBCR
40.0000 meq | EXTENDED_RELEASE_TABLET | Freq: Three times a day (TID) | ORAL | Status: DC
  Administered 2023-08-11 (×3): 40 meq via ORAL
  Filled 2023-08-11 (×3): qty 2

## 2023-08-11 MED ORDER — VANCOMYCIN HCL 1250 MG/250ML IV SOLN
1250.0000 mg | INTRAVENOUS | Status: DC
  Administered 2023-08-12: 1250 mg via INTRAVENOUS
  Filled 2023-08-11: qty 250

## 2023-08-11 MED ORDER — METRONIDAZOLE 500 MG/100ML IV SOLN
500.0000 mg | Freq: Two times a day (BID) | INTRAVENOUS | Status: DC
  Administered 2023-08-11 – 2023-08-12 (×2): 500 mg via INTRAVENOUS
  Filled 2023-08-11 (×3): qty 100

## 2023-08-11 MED ORDER — NICARDIPINE HCL IN NACL 20-0.86 MG/200ML-% IV SOLN
3.0000 mg/h | INTRAVENOUS | Status: DC
  Administered 2023-08-11: 5 mg/h via INTRAVENOUS
  Administered 2023-08-11: 10 mg/h via INTRAVENOUS
  Administered 2023-08-11: 5 mg/h via INTRAVENOUS
  Administered 2023-08-11: 10 mg/h via INTRAVENOUS
  Administered 2023-08-12 (×2): 3 mg/h via INTRAVENOUS
  Filled 2023-08-11 (×5): qty 200

## 2023-08-11 NOTE — Progress Notes (Addendum)
PHARMACY CONSULT NOTE - ELECTROLYTES  Pharmacy Consult for Electrolyte Monitoring and Replacement   Recent Labs: Height: 5\' 7"  (170.2 cm) Weight: 115.3 kg (254 lb 3.1 oz) IBW/kg (Calculated) : 66.1 Estimated Creatinine Clearance: 120.7 mL/min (by C-G formula based on SCr of 0.75 mg/dL). Potassium (mmol/L)  Date Value  08/11/2023 2.7 (LL)   Magnesium (mg/dL)  Date Value  47/82/9562 2.0   Calcium (mg/dL)  Date Value  13/07/6577 8.2 (L)   Albumin (g/dL)  Date Value  46/96/2952 3.6   Phosphorus (mg/dL)  Date Value  84/13/2440 3.1   Sodium (mmol/L)  Date Value  08/11/2023 137   Assessment  Logan Harrell is a 57 y.o. male presenting with leg swelling, hypokalemia. PMH significant for DM2, HTN, HLD . Pharmacy has been consulted to monitor and replace electrolytes.  Diet: CLD  MIVF: N/A Pertinent medications: Aldactone 25 mg daily, Kcl 40 mEq TID scheduled  Goal of Therapy: Electrolytes WNL  Plan:  Kcl 10 mEq x 4 and Kcl 40 mEq TID.  F/u with AM labs.   Thank you for allowing pharmacy to be a part of this patient's care.  Ronnald Ramp, PharmD, BCPS 08/11/2023 2:03 PM

## 2023-08-11 NOTE — Progress Notes (Signed)
   08/11/23 1810  Vitals  Temp (!) 101.8 F (38.8 C)  Temp Source Axillary  Pulse Rate (!) 111  ECG Heart Rate (!) 112  Resp (!) 37   Patient arrived to ICU after scan. When he arrived patient was lethargic and satting mid 33s with increased WOB- was put him on cpap at 3L, O2 improved, but his RR 30s-40. Temp 101.8, HR 115-120s. BP improving with cardene. Wife reports that patient develop cough yesterday but afebrile. MD notified of changes in patient condition.

## 2023-08-11 NOTE — Progress Notes (Deleted)
Transition of Care [facility]) - Inpatient Brief Assessment   Patient Details  Name: Logan Harrell MRN: [id number] Date of Birth: [birth year]  [facility] CM/SW Contact:    Truddie Hidden, RN Phone Number: 08/11/2023, 11:54 AM   Clinical Narrative:    Transition of Care Asessment: Insurance and Status: Insurance coverage has been reviewed Patient has primary care physician: No Home environment has been reviewed: Return to home Prior level of function:: Independent Prior/Current Home Services: No current home services Social Determinants of Health Reivew: SDOH reviewed no interventions necessary Readmission risk has been reviewed: Yes Transition of care needs: no transition of care needs at this time

## 2023-08-11 NOTE — Consult Note (Signed)
Date of Consultation:  08/11/2023  Requesting Physician:  Marcelino Duster, MD  Reason for Consultation:  Elevated LFTs  History of Present Illness: Logan Harrell is a 57 y.o. male admitted on 08/08/2023 with left leg swelling.  He had also noticed intermittent epigastric abdominal pain associated with nausea and vomiting.  On admission, he had a left lower extremity ultrasound which showed no DVT and he had an ultrasound of the right upper quadrant which showed cholelithiasis but no inflammatory changes to suggest cholecystitis.  The patient reports that he has had intermittent issues since January of epigastric tightness and discomfort after eating which leads to episodes of vomiting.  Is unclear which exact foods lead to this but mostly fruits resulted in the symptoms.  He does not typically eat greasy or fatty foods.  The pain has remained in the epigastric region without radiation.  Denies any constipation and has daily bowel movements.  His LFTs have been elevated over these 3 days with AST 148 today, ALT 121, and total bilirubin 3.7.  However this is mostly unconjugated hyperbilirubinemia.  He had a CT scan of his chest, abdomen, and pelvis yesterday which showed multiple findings.  Of note, he has mildly enlarged mediastinal and hilar lymph nodes as well as some nodules in bilateral lungs.  He also has a 1.9 x 1.7 cm left adrenal nodule and a small umbilical hernia with bilateral inguinal hernias.  During his hospital stay, he has been very hypertensive and his potassium has been low despite of IV repletion.  Medical team has suspicion for possible primary hyperaldosteronism and further lab studies have been ordered for this.  He is scheduled for an upper endoscopy today with gastroenterology for evaluation of possible gastroparesis.  Past Medical History: Past Medical History:  Diagnosis Date   Arthritis    Diabetes mellitus without complication (HCC)    Hyperlipidemia    Hypertension     Sleep apnea      Past Surgical History: History reviewed. No pertinent surgical history.  Home Medications: Prior to Admission medications   Medication Sig Start Date End Date Taking? Authorizing Provider  aspirin EC 81 MG tablet Take 81 mg by mouth daily.   Yes [provider]  empagliflozin (JARDIANCE) 25 MG TABS tablet Take 25 mg by mouth daily.   Yes [provider]  JANUMET XR 50-1000 MG TB24 Take 1 tablet by mouth daily. 06/21/23  Yes [provider]  KORLYM 300 MG TABS Take 600 mg by mouth daily. 2 tabs (600 mg) in am ,1 tab (300mg ) in pm 07/19/23  Yes [provider]  lisinopril (PRINIVIL,ZESTRIL) 20 MG tablet Take 20 mg by mouth daily.   Yes [provider]  miFEPRIStone (KORLYM) 300 MG TABS Take 300 mg by mouth at bedtime. 2 tabs in am (600mg ) and 1 tab (300mg ) in pm   Yes [provider]  Multiple Vitamin (MULTIVITAMIN) tablet Take 1 tablet by mouth daily.   Yes [provider]  rosuvastatin (CRESTOR) 10 MG tablet Take 10 mg by mouth daily.   Yes [provider]  Semaglutide (OZEMPIC) 0.25 or 0.5 MG/DOSE SOPN Inject 1 mg into the skin once a week.    Yes [provider]  vitamin C (ASCORBIC ACID) 500 MG tablet Take 500 mg by mouth daily.   Yes [provider]  VITAMIN D, CHOLECALCIFEROL, PO Take 1-2 capsules by mouth daily.   Yes [provider]  Saxagliptin-Metformin (KOMBIGLYZE XR) 2.04-999 MG TB24 Take 1 tablet  by mouth 2 (two) times daily. Patient not taking: Reported on 08/08/2023    [provider]  sildenafil (VIAGRA) 50 MG tablet  04/10/19   [provider]  spironolactone (ALDACTONE) 25 MG tablet Take 25 mg by mouth daily. Patient not taking: Reported on 08/08/2023 03/28/23   [provider]  Testosterone Enanthate (XYOSTED) 75 MG/0.5ML SOAJ Inject 75 mcg into the skin once a week. Patient not taking: Reported on 08/08/2023 05/24/19   Riki Altes, MD   TOUJEO SOLOSTAR 300 UNIT/ML [facility]  04/11/19   [provider]    Allergies: No Known Allergies  Social History:  reports that he has been smoking cigarettes. He has a 35 pack-year smoking history. He has never used smokeless tobacco. He reports current alcohol use of about 4.0 standard drinks of alcohol per week. He reports that he does not use drugs.   Family History: Family History  Problem Relation Age of Onset   Diabetes Mother    Diabetes Father     Review of Systems: Review of Systems  Constitutional:  Negative for chills and fever.  Respiratory:  Negative for shortness of breath.   Cardiovascular:  Negative for chest pain.  Gastrointestinal:  Positive for abdominal pain, nausea and vomiting.    Physical Exam BP (!) 198/91 (BP Location: Left Arm)   Pulse 82   Temp 100 F (37.8 C) (Oral)   Resp 16   Ht 5\' 7"  (1.702 m)   Wt 115.3 kg   SpO2 98%   BMI 39.81 kg/m  CONSTITUTIONAL: No acute distress HEENT:  Normocephalic, atraumatic, extraocular motion intact. RESPIRATORY:  Normal respiratory effort without pathologic use of accessory muscles. CARDIOVASCULAR: Regular rhythm and rate GI: The abdomen is soft, obese, non-distended, currently without any tenderness to palpation.  Negative Murphy's sign.  The patient has a small umbilical hernia which is reducible.  Unable to tell if he truly has inguinal hernias vs just a fatty cord bilaterally.  At least no bulging when he coughs/strains.  MUSCULOSKELETAL:  Normal muscle strength and tone in all four extremities.  No peripheral edema or cyanosis. NEUROLOGIC:  Motor and sensation is grossly normal.  Cranial nerves are grossly intact. PSYCH:  Alert and oriented to person, place and time. Affect is normal.  Laboratory Analysis: Results for orders placed or performed during the hospital encounter of 08/08/23 (from the past 24 hour(s))  Potassium     Status: Abnormal   Collection Time: 08/10/23  6:02 PM  Result Value  Ref Range   Potassium 2.5 (LL) 3.5 - 5.1 mmol/L  Glucose, capillary     Status: Abnormal   Collection Time: 08/10/23  8:31 PM  Result Value Ref Range   Glucose-Capillary 110 (H) 70 - 99 mg/dL  CBC     Status: Abnormal   Collection Time: 08/11/23  4:30 AM  Result Value Ref Range   WBC 5.4 4.0 - 10.5 K/uL   RBC 5.53 4.22 - 5.81 MIL/uL   Hemoglobin 12.5 (L) 13.0 - 17.0 g/dL   HCT 28.4 (L) 13.2 - 44.0 %   MCV 69.8 (L) 80.0 - 100.0 fL   MCH 22.6 (L) 26.0 - 34.0 pg   MCHC 32.4 30.0 - 36.0 g/dL   RDW 10.2 72.5 - 36.6 %   Platelets 251 150 - 400 K/uL   nRBC 0.0 0.0 - 0.2 %  Comprehensive metabolic panel     Status: Abnormal   Collection Time: 08/11/23  4:30 AM  Result Value Ref Range  Sodium 137 135 - 145 mmol/L   Potassium 2.4 (LL) 3.5 - 5.1 mmol/L   Chloride 97 (L) 98 - 111 mmol/L   CO2 28 22 - 32 mmol/L   Glucose, Bld 114 (H) 70 - 99 mg/dL   BUN 6 6 - 20 mg/dL   Creatinine, Ser 1.61 0.61 - 1.24 mg/dL   Calcium 8.2 (L) 8.9 - 10.3 mg/dL   Total Protein 6.8 6.5 - 8.1 g/dL   Albumin 3.6 3.5 - 5.0 g/dL   AST 096 (H) 15 - 41 U/L   ALT 121 (H) 0 - 44 U/L   Alkaline Phosphatase 40 38 - 126 U/L   Total Bilirubin 3.7 (H) 0.3 - 1.2 mg/dL   GFR, Estimated >04 >54 mL/min   Anion gap 12 5 - 15  Protime-INR     Status: None   Collection Time: 08/11/23  4:30 AM  Result Value Ref Range   Prothrombin Time 14.5 11.4 - 15.2 seconds   INR 1.1 0.8 - 1.2  Glucose, capillary     Status: Abnormal   Collection Time: 08/11/23  8:06 AM  Result Value Ref Range   Glucose-Capillary 127 (H) 70 - 99 mg/dL    Imaging: CT CHEST ABDOMEN PELVIS W CONTRAST  Result Date: 08/10/2023 CLINICAL DATA:  Abdominal pain, elevated liver enzymes, unintended weight loss with abdominal pain. Check for malignancy. EXAM: CT CHEST, ABDOMEN, AND PELVIS WITH CONTRAST TECHNIQUE: Multidetector CT imaging of the chest, abdomen and pelvis was performed following the standard protocol during bolus administration of intravenous  contrast. RADIATION DOSE REDUCTION: This exam was performed according to the departmental dose-optimization program which includes automated exposure control, adjustment of the mA and/or kV according to patient size and/or use of iterative reconstruction technique. CONTRAST:  OMNIPAQUE IOHEXOL 300 MG/ML  SOLN COMPARISON:  Right upper quadrant ultrasound 08/08/2023 showing cholelithiasis without acute cholecystitis, and PA and lateral chest 08/08/2023. No prior CT for comparison. FINDINGS: CT CHEST FINDINGS Cardiovascular: There is mild cardiomegaly. No pericardial effusion. Minimal scattered three-vessel coronary artery calcifications. Pulmonary arteries are normal caliber and centrally clear. The aorta is normal in caliber and course with mild atherosclerosis. The great vessels are clear with normal variant brachiobicarotid trunk. The pulmonary veins are nondistended. Mediastinum/Nodes: The visualized thyroid gland is unremarkable. Axillary spaces are clear. There are mildly enlarged mediastinal and hilar nodes, for example a precarinal lymph node measures 1.6 cm short axis on 2:23, AP window lymph node 1 cm short axis on 2:21, left paratracheal lymph node is 1.1 cm short axis on 2:16, and a subcarinal lymph node is 1.3 cm in short axis altered: 30. There are scattered left hilar lymph nodes up to 1.2 cm in short axis on 2:32, scattered right hilar nodes up to 1.1 cm in short axis on 2:28. There is no bulky or encasing adenopathy. Findings are nonspecific. The thoracic esophagus, thoracic trachea and main bronchi are unremarkable. Lungs/Pleura: No pleural effusion, thickening or pneumothorax. Mild elevation right hemidiaphragm. There are mild centrilobular emphysematous changes in the upper lobes. There is mild posterior atelectasis in the upper and lower lobes. There are few linear scar-like opacities in both bases. Mild diffuse bronchial thickening is seen without bronchial plugging or bronchiectasis. There  is a 6 mm subpleural ground-glass nodule in the right lower lobe medially on 4:76. There is a 3 mm subpleural left lower lobe nodule posteriorly on 4:96. There is a 6 mm subpleural right upper lobe nodule laterally on 4:45. No other nodules or active  infiltrates are seen Musculoskeletal: There is spondylosis and bridging enthesopathy of the thoracic spine but no acute or other significant osseous findings, no aggressive lesion is seen. The ribcage is intact. There is bilateral moderate gynecomastia but no chest wall mass. CT ABDOMEN PELVIS FINDINGS Hepatobiliary: The liver is 21.5 cm in length and mildly steatotic, without mass enhancement. There is dilatation of the gallbladder up to 12 cm length but no wall thickening or bile duct dilatation. There are multiple tiny stones layering dependently in the gallbladder. Pancreas: No abnormality. Spleen: No abnormality. Adrenals/Urinary Tract: There is a left adrenal nodule measuring 1.9 x 1.7 cm, Hounsfield density is 82. Adrenal washout CT or chemical shift MRI recommended. The right adrenal and both kidneys are unremarkable apart from a 1.3 cm cyst posteriorly in the left kidney, Hounsfield density of 8.3. No follow-up imaging is recommended. There is no urinary stone or obstruction. There is mild generalized thickening of the bladder versus underdistention. Stomach/Bowel: No dilatation or wall thickening, including of the appendix. There is colonic diverticulosis without evidence of diverticulitis. Vascular/Lymphatic: Aortic atherosclerosis. No enlarged abdominal or pelvic lymph nodes. Reproductive: There is no prostatomegaly. There is calcification in the right hemiprostate. There are mild stranding changes in the pelvic floor alongside the prostate, nonspecific but may be seen with prostatitis. Clinical correlation advised. Other: Small umbilical and inguinal fat hernias. No incarcerated hernia. No free fluid, free hemorrhage or free air. Musculoskeletal: There is  advanced L4-5 facet hypertrophy, grade 1 L4-5 degenerative spondylolisthesis and severe acquired L4-5 spinal canal stenosis. Enthesopathic changes of the pelvis are also noted as well as ankylosis across the anterior SI joints. No acute or other significant osseous findings. IMPRESSION: 1. Mildly enlarged mediastinal and hilar lymph nodes, nonspecific. 2. Emphysema and bronchitis without evidence of pneumonia. 3. 6 mm ground-glass nodule in the right lower lobe, 3 mm subpleural nodule in the left lower lobe and 6 mm subpleural nodule in the right upper lobe. Initial follow-up with CT at 6 months is recommended to confirm persistence. If persistent, repeat CT is recommended every 2 years until 5 years of stability has been established. This recommendation follows the consensus statement: Guidelines for Management of Incidental Pulmonary Nodules Detected on CT Images: From the Fleischner Society 2017; Radiology 2017; 284:228-243. 4. 1.9 x 1.7 cm left adrenal nodule. Adrenal washout CT or chemical shift MRI recommended. 5. Cholelithiasis with dilated gallbladder but no wall thickening or bile duct dilatation. 6. Cystitis versus bladder nondistention. 7. Mild stranding changes in the pelvic floor alongside the normal-sized prostate, nonspecific but may be seen with prostatitis. 8. Aortic and coronary artery atherosclerosis. Mild cardiomegaly. 9. Diverticulosis without evidence of diverticulitis. 10. Small umbilical and inguinal fat hernias. 11. Advanced L4-5 facet hypertrophy, grade 1 L4-5 degenerative spondylolisthesis and severe acquired L4-5 spinal canal stenosis. Aortic Atherosclerosis (ICD10-I70.0) and Emphysema (ICD10-J43.9). Electronically Signed   By: Almira Bar M.D.   On: 08/10/2023 21:37    Assessment and Plan: This is a 57 y.o. male with elevated LFTs  --Discussed with the patient how the liver enzymes can get elevated sometimes and how the total bilirubin can also get elevated.  These sometimes can  be indicative of gallbladder pathology such as cholecystitis.  However, his ultrasound and CT scan did not show any inflammatory changes, and on exam he does not have any abdominal pain.  His symptoms are intermittent only, and there's no clear patter with greasy/fatty foods.  He reports his epigastric discomfort is with fruits.  No problems with  liquids.   --The patient is scheduled for an EGD today to further evaluate for possible gastroparesis.  HIDA scan has also been ordered to evaluate for cholecystitis.  Discussed with him that he may need surgery if HIDA is positive.  However, with his low potassium and remaining very hypertensive, this could present an issue for general anesthesia. --For now, OK to keep NPO for his procedures and HIDA scan.  Will follow after HIDA scan performed.  I spent 60 minutes dedicated to the care of this patient on the date of this encounter to include pre-visit review of records, face-to-face time with the patient discussing diagnosis and management, and any post-visit coordination of care.   Howie Ill, MD [facility] Pg:  [phone]

## 2023-08-11 NOTE — Progress Notes (Signed)
Notified by lab of critical potassium 2.4. Para March, MD notified of result, see new orders. Patient is currently resting with call bell within reach and wife at bedside.   Lamonte Richer, RN

## 2023-08-11 NOTE — Progress Notes (Signed)
Per Dr. Eppie Gibson okay for RN to order 3mg  morphine iv one time dose.

## 2023-08-11 NOTE — Progress Notes (Addendum)
PHARMACY CONSULT NOTE - ELECTROLYTES  Pharmacy Consult for Electrolyte Monitoring and Replacement   Recent Labs: Height: 5\' 7"  (170.2 cm) Weight: 115.3 kg (254 lb 3.1 oz) IBW/kg (Calculated) : 66.1 Estimated Creatinine Clearance: 120.7 mL/min (by C-G formula based on SCr of 0.75 mg/dL). Potassium (mmol/L)  Date Value  08/11/2023 2.4 (LL)   Magnesium (mg/dL)  Date Value  12/22/7251 2.0   Calcium (mg/dL)  Date Value  66/44/0347 8.2 (L)   Albumin (g/dL)  Date Value  42/59/5638 3.6   Phosphorus (mg/dL)  Date Value  75/64/3329 3.1   Sodium (mmol/L)  Date Value  08/11/2023 137   Assessment  Logan Harrell is a 57 y.o. male presenting with leg swelling, hypokalemia. PMH significant for DM2, HTN, HLD . Pharmacy has been consulted to monitor and replace electrolytes.  Diet: CLD  MIVF: N/A Pertinent medications: Aldactone 25 mg daily, Kcl 40 mEq BID scheduled  Goal of Therapy: Electrolytes WNL  Plan:  Medical team ordered Kcl 10 mEq x 5 and Kcl 40 mEq TID.  F/u with K+ @ 1300 F/u with AM labs.   Thank you for allowing pharmacy to be a part of this patient's care.  Ronnald Ramp, PharmD, BCPS 08/11/2023 8:12 AM

## 2023-08-11 NOTE — Progress Notes (Signed)
Pharmacy Antibiotic Note  Logan Harrell is a 57 y.o. male admitted on 08/08/2023 for abdominal pain, bilateral leg edema, vomiting, and poor oral intake x2 weeks. Patient has mild transaminitis, however, CT of abdomen showed no acute infectious processes. Patient was scheduled for EGD today, however, procedure was cancelled in s/o hypokalemia and sever hypertension. After transfer to ICU, patient developed a fever of 101.33F with no leukocytosis. Pharmacy has been consulted for vancomycin and cefepime dosing.  Plan: Give vancomycin 2000 mg IV load x1 Start vancomycin 1250 mg IV every 12 hours (eAUC 518.4, Scr 0.8, Vd 0.5 L/kg) Start cefepime 2 g IV every 8 hours based on current renal function Continue metronidazole per MD Monitor renal function, clinical status, culture data, and LOT  Height: 5\' 8"  (172.7 cm) Weight: 114.5 kg (252 lb 6.8 oz) IBW/kg (Calculated) : 68.4  Temp (24hrs), Avg:99.7 F (37.6 C), Min:98.7 F (37.1 C), Max:101.8 F (38.8 C)  Recent Labs  Lab 08/08/23 1329 08/09/23 0445 08/10/23 0428 08/11/23 0430  WBC 6.5  --  6.7 5.4  CREATININE 0.86 0.73 0.70 0.75    Estimated Creatinine Clearance: 122.1 mL/min (by C-G formula based on SCr of 0.75 mg/dL).    No Known Allergies  Antimicrobials this admission: vancomycin 8/22 >>  cefepime 8/22 >>   Dose adjustments this admission: N/A  Microbiology results: 8/22 BCx: pending 8/22 MRSA PCR: negative  Thank you for involving pharmacy in this patient's care.   Rockwell Alexandria, PharmD Clinical Pharmacist 08/11/2023 7:30 PM

## 2023-08-11 NOTE — Progress Notes (Addendum)
Inpatient Follow-up/Progress Note   Patient ID: Logan Harrell is a 57 y.o. male.  Overnight Events / Subjective Findings Pt underwent CT last night with findings as below. Planned for EGD today, however, due to persistent hypokalemia and extreme hypertension, unable to undergo anesthesia. At this point we will defer this exam and try for UGI study tmrw. No n/v. No abd pain today. Able to tolerate liquids yesterday without issue. No other acute gi complaints.  Review of Systems  Constitutional:  Positive for appetite change and unexpected weight change. Negative for activity change, chills, diaphoresis, fatigue and fever.  HENT:  Negative for trouble swallowing and voice change.   Respiratory:  Negative for shortness of breath and wheezing.   Cardiovascular:  Negative for chest pain, palpitations and leg swelling.  Gastrointestinal:  Positive for nausea. Negative for abdominal distention, abdominal pain, anal bleeding, blood in stool, constipation, diarrhea and vomiting.  Musculoskeletal:  Negative for arthralgias and myalgias.  Skin:  Negative for color change and pallor.  Neurological:  Positive for headaches. Negative for dizziness, syncope and weakness.  Psychiatric/Behavioral:  Negative for confusion. The patient is not nervous/anxious.   All other systems reviewed and are negative.    Medications  Current Facility-Administered Medications:    (feeding supplement) PROSource Plus liquid 30 mL, 30 mL, Oral, BID BM, Sreeram, Narendranath, MD, 30 mL at 08/10/23 1058   acetaminophen (TYLENOL) tablet 650 mg, 650 mg, Oral, Q6H PRN **OR** acetaminophen (TYLENOL) suppository 650 mg, 650 mg, Rectal, Q6H PRN, Gertha Calkin, MD   aspirin EC tablet 81 mg, 81 mg, Oral, Daily, Gertha Calkin, MD, 81 mg at 08/11/23 0908   feeding supplement (BOOST / RESOURCE BREEZE) liquid 1 Container, 1 Container, Oral, TID BM, Marcelino Duster, MD, 1 Container at 08/10/23 1058   hydrALAZINE (APRESOLINE)  injection 5 mg, 5 mg, Intravenous, Q4H PRN, Gertha Calkin, MD, 5 mg at 08/11/23 1234   lisinopril (ZESTRIL) tablet 40 mg, 40 mg, Oral, Daily, Sreeram, Narendranath, MD, 40 mg at 08/11/23 0908   LORazepam (ATIVAN) injection 1 mg, 1 mg, Intravenous, Once, Sreeram, [name], MD   LORazepam (ATIVAN) tablet 0.5 mg, 0.5 mg, Oral, Q8H PRN, Marcelino Duster, MD, 0.5 mg at 08/11/23 1459   melatonin tablet 2.5 mg, 2.5 mg, Oral, QHS, Sreeram, Narendranath, MD, 2.5 mg at 08/10/23 2105   metoprolol tartrate (LOPRESSOR) injection 5 mg, 5 mg, Intravenous, Q6H PRN, Marcelino Duster, MD, 5 mg at 08/11/23 0544   metoprolol tartrate (LOPRESSOR) tablet 25 mg, 25 mg, Oral, BID, Marcelino Duster, MD, 25 mg at 08/11/23 0908   morphine (PF) 2 MG/ML injection 2 mg, 2 mg, Intravenous, Q4H PRN, Gertha Calkin, MD   multivitamin with minerals tablet 1 tablet, 1 tablet, Oral, Daily, Sreeram, Narendranath, MD, 1 tablet at 08/11/23 1005   nicardipine (CARDENE) 20mg  in 0.86% saline IV infusion (0.1 mg/ml), 3-15 mg/hr, Intravenous, Continuous, Sreeram, Narendranath, MD   ondansetron (ZOFRAN) tablet 4 mg, 4 mg, Oral, Q6H PRN **OR** ondansetron (ZOFRAN) injection 4 mg, 4 mg, Intravenous, Q6H PRN, Allena Katz, Ekta V, MD   pantoprazole (PROTONIX) injection 40 mg, 40 mg, Intravenous, Q12H, Irena Cords V, MD, 40 mg at 08/11/23 1005   potassium chloride 10 mEq in 100 mL IVPB, 10 mEq, Intravenous, Q1 Hr x 4, Sreeram, Narendranath, MD   potassium chloride SA (KLOR-CON M) CR tablet 40 mEq, 40 mEq, Oral, TID, Sreeram, Narendranath, MD, 40 mEq at 08/11/23 0908   sodium chloride flush (NS) 0.9 % injection 3 mL,  3 mL, Intravenous, Q12H, Irena Cords V, MD, 3 mL at 08/11/23 1006   [START ON 08/12/2023] spironolactone (ALDACTONE) tablet 50 mg, 50 mg, Oral, Daily, Sreeram, Narendranath, MD  niCARDipine     potassium chloride      acetaminophen **OR** acetaminophen, hydrALAZINE, LORazepam, metoprolol tartrate, morphine injection,  ondansetron **OR** ondansetron (ZOFRAN) IV   Objective    Vitals:   08/11/23 0536 08/11/23 0635 08/11/23 0828 08/11/23 1218  BP: (!) 203/94 (!) 200/107 (!) 198/91 (!) 209/105  Pulse: 76 79 82 80  Resp: 18 18 16 16   Temp: 99 F (37.2 C) 98.7 F (37.1 C) 100 F (37.8 C) 100.3 F (37.9 C)  TempSrc: Oral Oral Oral Oral  SpO2: 97% 94% 98% 98%  Weight:      Height:         Physical Exam Vitals and nursing note reviewed.  Constitutional:      General: He is not in acute distress.    Appearance: He is obese. He is not ill-appearing, toxic-appearing or diaphoretic.  HENT:     Head: Normocephalic and atraumatic.     Nose: Nose normal.     Mouth/Throat:     Mouth: Mucous membranes are moist.     Pharynx: Oropharynx is clear.  Eyes:     General: No scleral icterus.    Extraocular Movements: Extraocular movements intact.  Cardiovascular:     Rate and Rhythm: Normal rate and regular rhythm.     Heart sounds: Normal heart sounds. No murmur heard.    No friction rub. No gallop.     Comments: Extreme htn Pulmonary:     Effort: Pulmonary effort is normal. No respiratory distress.     Breath sounds: Normal breath sounds. No wheezing, rhonchi or rales.  Abdominal:     General: Bowel sounds are normal. There is no distension.     Palpations: Abdomen is soft.     Tenderness: There is no abdominal tenderness. There is no guarding or rebound.  Musculoskeletal:     Cervical back: Neck supple.     Right lower leg: No edema.     Left lower leg: No edema.  Skin:    General: Skin is warm and dry.     Coloration: Skin is not jaundiced or pale.  Neurological:     General: No focal deficit present.     Mental Status: He is alert and oriented to person, place, and time. Mental status is at baseline.  Psychiatric:        Mood and Affect: Mood normal.        Behavior: Behavior normal.        Thought Content: Thought content normal.        Judgment: Judgment normal.      Laboratory  Data Recent Labs  Lab 08/08/23 1329 08/10/23 0428 08/11/23 0430  WBC 6.5 6.7 5.4  HGB 12.3* 13.1 12.5*  HCT 37.3* 39.7 38.6*  PLT 261 279 251   Recent Labs  Lab 08/09/23 0445 08/09/23 1836 08/10/23 0428 08/10/23 1802 08/11/23 0430 08/11/23 1300  NA 144  --  140  --  137  --   K 2.1*   < > 2.3* 2.5* 2.4* 2.7*  CL 103  --  100  --  97*  --   CO2 29  --  29  --  28  --   BUN 6  --  6  --  6  --   CREATININE 0.73  --  0.70  --  0.75  --   CALCIUM 7.9*  --  8.0*  --  8.2*  --   PROT 6.3*  --  6.3*  --  6.8  --   BILITOT 3.2*  --  3.3*  --  3.7*  --   ALKPHOS 47  --  41  --  40  --   ALT 142*  --  118*  --  121*  --   AST 174*  --  125*  --  148*  --   GLUCOSE 102*  --  118*  --  114*  --    < > = values in this interval not displayed.   Recent Labs  Lab 08/11/23 0430  INR 1.1      Imaging Studies: CT CHEST ABDOMEN PELVIS W CONTRAST  Result Date: 08/10/2023 CLINICAL DATA:  Abdominal pain, elevated liver enzymes, unintended weight loss with abdominal pain. Check for malignancy. EXAM: CT CHEST, ABDOMEN, AND PELVIS WITH CONTRAST TECHNIQUE: Multidetector CT imaging of the chest, abdomen and pelvis was performed following the standard protocol during bolus administration of intravenous contrast. RADIATION DOSE REDUCTION: This exam was performed according to the departmental dose-optimization program which includes automated exposure control, adjustment of the mA and/or kV according to patient size and/or use of iterative reconstruction technique. CONTRAST:  OMNIPAQUE IOHEXOL 300 MG/ML  SOLN COMPARISON:  Right upper quadrant ultrasound 08/08/2023 showing cholelithiasis without acute cholecystitis, and PA and lateral chest 08/08/2023. No prior CT for comparison. FINDINGS: CT CHEST FINDINGS Cardiovascular: There is mild cardiomegaly. No pericardial effusion. Minimal scattered three-vessel coronary artery calcifications. Pulmonary arteries are normal caliber and centrally clear.  The aorta is normal in caliber and course with mild atherosclerosis. The great vessels are clear with normal variant brachiobicarotid trunk. The pulmonary veins are nondistended. Mediastinum/Nodes: The visualized thyroid gland is unremarkable. Axillary spaces are clear. There are mildly enlarged mediastinal and hilar nodes, for example a precarinal lymph node measures 1.6 cm short axis on 2:23, AP window lymph node 1 cm short axis on 2:21, left paratracheal lymph node is 1.1 cm short axis on 2:16, and a subcarinal lymph node is 1.3 cm in short axis altered: 30. There are scattered left hilar lymph nodes up to 1.2 cm in short axis on 2:32, scattered right hilar nodes up to 1.1 cm in short axis on 2:28. There is no bulky or encasing adenopathy. Findings are nonspecific. The thoracic esophagus, thoracic trachea and main bronchi are unremarkable. Lungs/Pleura: No pleural effusion, thickening or pneumothorax. Mild elevation right hemidiaphragm. There are mild centrilobular emphysematous changes in the upper lobes. There is mild posterior atelectasis in the upper and lower lobes. There are few linear scar-like opacities in both bases. Mild diffuse bronchial thickening is seen without bronchial plugging or bronchiectasis. There is a 6 mm subpleural ground-glass nodule in the right lower lobe medially on 4:76. There is a 3 mm subpleural left lower lobe nodule posteriorly on 4:96. There is a 6 mm subpleural right upper lobe nodule laterally on 4:45. No other nodules or active infiltrates are seen Musculoskeletal: There is spondylosis and bridging enthesopathy of the thoracic spine but no acute or other significant osseous findings, no aggressive lesion is seen. The ribcage is intact. There is bilateral moderate gynecomastia but no chest wall mass. CT ABDOMEN PELVIS FINDINGS Hepatobiliary: The liver is 21.5 cm in length and mildly steatotic, without mass enhancement. There is dilatation of the gallbladder up to 12 cm length  but no wall thickening or bile duct dilatation.  There are multiple tiny stones layering dependently in the gallbladder. Pancreas: No abnormality. Spleen: No abnormality. Adrenals/Urinary Tract: There is a left adrenal nodule measuring 1.9 x 1.7 cm, Hounsfield density is 82. Adrenal washout CT or chemical shift MRI recommended. The right adrenal and both kidneys are unremarkable apart from a 1.3 cm cyst posteriorly in the left kidney, Hounsfield density of 8.3. No follow-up imaging is recommended. There is no urinary stone or obstruction. There is mild generalized thickening of the bladder versus underdistention. Stomach/Bowel: No dilatation or wall thickening, including of the appendix. There is colonic diverticulosis without evidence of diverticulitis. Vascular/Lymphatic: Aortic atherosclerosis. No enlarged abdominal or pelvic lymph nodes. Reproductive: There is no prostatomegaly. There is calcification in the right hemiprostate. There are mild stranding changes in the pelvic floor alongside the prostate, nonspecific but may be seen with prostatitis. Clinical correlation advised. Other: Small umbilical and inguinal fat hernias. No incarcerated hernia. No free fluid, free hemorrhage or free air. Musculoskeletal: There is advanced L4-5 facet hypertrophy, grade 1 L4-5 degenerative spondylolisthesis and severe acquired L4-5 spinal canal stenosis. Enthesopathic changes of the pelvis are also noted as well as ankylosis across the anterior SI joints. No acute or other significant osseous findings. IMPRESSION: 1. Mildly enlarged mediastinal and hilar lymph nodes, nonspecific. 2. Emphysema and bronchitis without evidence of pneumonia. 3. 6 mm ground-glass nodule in the right lower lobe, 3 mm subpleural nodule in the left lower lobe and 6 mm subpleural nodule in the right upper lobe. Initial follow-up with CT at 6 months is recommended to confirm persistence. If persistent, repeat CT is recommended every 2 years until 5  years of stability has been established. This recommendation follows the consensus statement: Guidelines for Management of Incidental Pulmonary Nodules Detected on CT Images: From the Fleischner Society 2017; Radiology 2017; 284:228-243. 4. 1.9 x 1.7 cm left adrenal nodule. Adrenal washout CT or chemical shift MRI recommended. 5. Cholelithiasis with dilated gallbladder but no wall thickening or bile duct dilatation. 6. Cystitis versus bladder nondistention. 7. Mild stranding changes in the pelvic floor alongside the normal-sized prostate, nonspecific but may be seen with prostatitis. 8. Aortic and coronary artery atherosclerosis. Mild cardiomegaly. 9. Diverticulosis without evidence of diverticulitis. 10. Small umbilical and inguinal fat hernias. 11. Advanced L4-5 facet hypertrophy, grade 1 L4-5 degenerative spondylolisthesis and severe acquired L4-5 spinal canal stenosis. Aortic Atherosclerosis (ICD10-I70.0) and Emphysema (ICD10-J43.9). Electronically Signed   By: Almira Bar M.D.   On: 08/10/2023 21:37    Assessment:   # Fluctuating transaminitis - suspect 2/2 hypertensive urgency and congestive hepatopathy, and poo rpo intake - HIDA scan ordered by surgical team given dilation on CT to r/o acalculous cholecystitis - no inflammation on ct; tb is indirect predominant - no abdominal pain  # MASH  # n/v, post prandial abdominal pain - suspect gastroparesis element but other medical conditions may be contributing - due to hypertensive urgency and hypokalemia, egd could not be performed today (8/22) as previously planned  # hypertensive urgency  # severe hypokalemia  # abnormal imaging of the adrenal gland may be contributing to htn and hypokalemia- hospitalist team investigating  Plan:  Since he was not able to undergo egd today despite best efforts to improve his hypokalemia and htn, recommend esophagram/upper gi study to rule out obstruction  Can plan for egd and colonoscopy as outpatient  if needed Will need GES as outpatient Trend lfts - already downtrending. Preserved liver function. Ggt normal   Tolerated clear liquids w/o issue. Can advance  as tolerated Symptomatic control and supportive care such as anti emetics as per primary team.   Dr. Mia Creek will be taking over the service tmrw. I will discuss the patient with him for follow up while in the hospital.  I personally performed the service.  Management of other medical comorbidities as per primary team  Thank you for allowing [place] to participate in this patient's care. Please don't hesitate to call if any questions or concerns arise.   Jaynie Collins, DO [facility]  Portions of the record may have been created with voice recognition software. Occasional wrong-word or 'sound-a-like' substitutions may have occurred due to the inherent limitations of voice recognition software.  Read the chart carefully and recognize, using context, where substitutions may have occurred.

## 2023-08-11 NOTE — Progress Notes (Signed)
08/11/23  HIDA scan images personally viewed.  Gallbladder fills showing no cystic duct obstruction.  Common bile duct is also patent.  At this point, no surgical reason for his elevated LFTs and no emergent cholecystectomy is needed.  Would defer to GI for further workup.  Henrene Dodge, MD

## 2023-08-11 NOTE — Progress Notes (Signed)
Progress Note   Patient: Logan Harrell [id number]:[id number] DOB: 01/28/1966 DOA: 08/08/2023     2 DOS: the patient was seen and examined on 08/11/2023   Brief hospital course: Brief hospital course: Zephen Marinez is a 57 y.o. male with medical history significant for diabetes mellitus type 2, hypertension, obesity, sleep apnea presenting with leg swelling that is been going on for for the past 2 months or so over the past 2 weeks.  Patient also presenting with intermittent abdominal pain since January that is worse sometimes with eating sometimes unrelated to eating patient's appetite has decreased due to this and is not able to eat a full meal because of nausea and vomiting.  Patient also report weight loss. Patient is taking mifepristone (steroids for high blood sugars) no history of Cushing syndrome though.  Is admitted to the hospitalist service with impression of bilateral leg edema rule out CHF, elevated troponin, severe hypokalemia and and cannot hypertension, abnormal LFTs.  Mifepristone is stopped as it can cause severe hypokalemia, prolonged QTc.  GI evaluated him planned for endoscopy, but due to high BP, low K its postponed. Due to elevated BP, very low K not responding to IV and oral supplements, I transferred him to stepdown, started cardene drip. He is also seen by surgery team for elevated LFT, cholelithiasis. HIDA Scan done today shows no common or cystic duct obstruction. No surgical intervention. Post HIDA, he was noted to have fever, tachycardia, hypoxia. Sepsis protocol initiated.   Assessment and Plan: * Bilateral leg edema Unlikely CHF. Encouraged leg elevation. Echo showed normal EF. Avoid hepatotoxic drugs. Strict I/O and daily weights.   Sepsis unknown origin. Fever, tachycardia, tachypnea, hypoxia noted. Started IV fluids per sepsis protocol. Blood cultures sent. Urinalysis with reflex sent. Chest xray ordered. IV Cefepime, flagyl, Vanco per pharmacy.  Elevated  troponin QTc prolonged at 544. Mifepristone can cause elevated Qtc, will continue to hold it. BNP of 477 and troponin of 82 flat Echo unremarkable, he reports no chest pain. Continue to follow and replace electrolytes.   Severe Hypokalemia Due to GI losses vs Mifepristone adverse effect vs primary aldosteronism Continue to replace IV and oral potassium per Pharmacy protocol.  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 Aldosterone/ renin level pending. I discussed with [facility] (Dr. Lum Babe) regarding his symptoms, labs, CT finding of left adrenal nodule. She suggested further outpatient work up, once discharged her office will call him to schedule appointment.   Cholelithiasis without obstruction Elevated LFTs RUQ uls showed choledocholithiasis, no cholecystitis. GGT wnl. Non obstructive etiology on Hida scan. General surgery recommended no intervention. Intermittent abdominal pain, nausea, vomiting with weight loss. GI advised endoscopy, unable to schedule due to elevated BP, low K. Clear liquid diet and advance as tolerated. Acute hep panel negative. Hold statin. Continue to trend LFT.   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 Will get nephrology eval for resistant hypertension. Transfer to stepdown unit for Cardene drip titration. Continue lisinopril to 40, aldactone 50, increase metoprolol to 50 and PRN hydralazine.   History of type 2 diabetes mellitus A1C 5.8, no need for accucheks. Hypoglycemia protocol as he is eating poor.  Obesity - BM1 38.8 States he lost lot of weight. Unintentional weight loss need outpatient work up including colonoscopy. GI plan for endoscopy later once more stable.       Subjective: Patient is seen and examined today morning. He is restless, did not eat,  waiting for his endoscopy. Complained of bilateral groin pain. BP running high. K level  low even after repletion. Explained him and his wife about aldosteronism.   Physical Exam: Vitals:   08/11/23 0536 08/11/23 0635 08/11/23 0828 08/11/23 1218  BP: (!) 203/94 (!) 200/107 (!) 198/91 (!) 209/105  Pulse: 76 79 82 80  Resp: 18 18 16 16   Temp: 99 F (37.2 C) 98.7 F (37.1 C) 100 F (37.8 C) 100.3 F (37.9 C)  TempSrc: Oral Oral Oral Oral  SpO2: 97% 94% 98% 98%  Weight:      Height:       General -  Middle aged African American male, restless HEENT - PERRLA, EOMI, atraumatic head, non tender sinuses. Lung - Clear, rales, rhonchi, wheezes. Heart - S1, S2 heard, no murmurs, rubs, trace left > right pedal edema Abdomen - soft, non tender, obese, bowel sounds good. Neuro - Alert, awake and oriented x 3, non focal exam. Skin - Warm and dry.  Data Reviewed:     Latest Ref Rng & Units 08/11/2023    7:08 PM 08/11/2023    4:30 AM 08/10/2023    4:28 AM  CBC  WBC 4.0 - 10.5 K/uL 7.7  5.4  6.7   Hemoglobin 13.0 - 17.0 g/dL 95.6  21.3  08.6   Hematocrit 39.0 - 52.0 % 40.1  38.6  39.7   Platelets 150 - 400 K/uL 244  251  279        Latest Ref Rng & Units 08/11/2023    7:08 PM 08/11/2023    1:00 PM 08/11/2023    4:30 AM  BMP  Glucose 70 - 99 mg/dL 578   469   BUN 6 - 20 mg/dL 7   6   Creatinine 6.29 - 1.24 mg/dL 5.28   4.13   Sodium 244 - 145 mmol/L 138   137   Potassium 3.5 - 5.1 mmol/L 2.5  2.7  2.4   Chloride 98 - 111 mmol/L 98   97   CO2 22 - 32 mmol/L 26   28   Calcium 8.9 - 10.3 mg/dL 8.1   8.2    NM Hepatobiliary Liver Func  Result Date: 08/11/2023 CLINICAL DATA:  Cholelithiasis EXAM: NUCLEAR MEDICINE HEPATOBILIARY IMAGING TECHNIQUE: Sequential images of the abdomen were obtained out to 60 minutes following intravenous administration of radiopharmaceutical. RADIOPHARMACEUTICALS:  7.67 mCi Tc-73m  Choletec IV COMPARISON:  CT 08/10/2023.  Ultrasound abdomen 08/08/2023 FINDINGS: Prompt uptake and biliary excretion of activity by the liver is seen. Gallbladder activity  is visualized by 60 minutes proximally, consistent with patency of cystic duct. Biliary activity passes into small bowel, consistent with patent common bile duct. IMPRESSION: No common duct or cystic duct obstruction. Electronically Signed   By: Karen Kays M.D.   On: 08/11/2023 17:42   CT CHEST ABDOMEN PELVIS W CONTRAST  Result Date: 08/10/2023 CLINICAL DATA:  Abdominal pain, elevated liver enzymes, unintended weight loss with abdominal pain. Check for malignancy. EXAM: CT CHEST, ABDOMEN, AND PELVIS WITH CONTRAST TECHNIQUE: Multidetector CT imaging of the chest, abdomen and pelvis was performed following the standard protocol during bolus administration of intravenous contrast. RADIATION DOSE REDUCTION: This exam was performed according to the departmental dose-optimization program which includes automated exposure control, adjustment of the mA and/or kV according to patient size and/or use of iterative reconstruction technique. CONTRAST:  OMNIPAQUE IOHEXOL 300 MG/ML  SOLN COMPARISON:  Right upper quadrant ultrasound 08/08/2023 showing cholelithiasis without acute cholecystitis, and PA  and lateral chest 08/08/2023. No prior CT for comparison. FINDINGS: CT CHEST FINDINGS Cardiovascular: There is mild cardiomegaly. No pericardial effusion. Minimal scattered three-vessel coronary artery calcifications. Pulmonary arteries are normal caliber and centrally clear. The aorta is normal in caliber and course with mild atherosclerosis. The great vessels are clear with normal variant brachiobicarotid trunk. The pulmonary veins are nondistended. Mediastinum/Nodes: The visualized thyroid gland is unremarkable. Axillary spaces are clear. There are mildly enlarged mediastinal and hilar nodes, for example a precarinal lymph node measures 1.6 cm short axis on 2:23, AP window lymph node 1 cm short axis on 2:21, left paratracheal lymph node is 1.1 cm short axis on 2:16, and a subcarinal lymph node is 1.3 cm in short axis  altered: 30. There are scattered left hilar lymph nodes up to 1.2 cm in short axis on 2:32, scattered right hilar nodes up to 1.1 cm in short axis on 2:28. There is no bulky or encasing adenopathy. Findings are nonspecific. The thoracic esophagus, thoracic trachea and main bronchi are unremarkable. Lungs/Pleura: No pleural effusion, thickening or pneumothorax. Mild elevation right hemidiaphragm. There are mild centrilobular emphysematous changes in the upper lobes. There is mild posterior atelectasis in the upper and lower lobes. There are few linear scar-like opacities in both bases. Mild diffuse bronchial thickening is seen without bronchial plugging or bronchiectasis. There is a 6 mm subpleural ground-glass nodule in the right lower lobe medially on 4:76. There is a 3 mm subpleural left lower lobe nodule posteriorly on 4:96. There is a 6 mm subpleural right upper lobe nodule laterally on 4:45. No other nodules or active infiltrates are seen Musculoskeletal: There is spondylosis and bridging enthesopathy of the thoracic spine but no acute or other significant osseous findings, no aggressive lesion is seen. The ribcage is intact. There is bilateral moderate gynecomastia but no chest wall mass. CT ABDOMEN PELVIS FINDINGS Hepatobiliary: The liver is 21.5 cm in length and mildly steatotic, without mass enhancement. There is dilatation of the gallbladder up to 12 cm length but no wall thickening or bile duct dilatation. There are multiple tiny stones layering dependently in the gallbladder. Pancreas: No abnormality. Spleen: No abnormality. Adrenals/Urinary Tract: There is a left adrenal nodule measuring 1.9 x 1.7 cm, Hounsfield density is 82. Adrenal washout CT or chemical shift MRI recommended. The right adrenal and both kidneys are unremarkable apart from a 1.3 cm cyst posteriorly in the left kidney, Hounsfield density of 8.3. No follow-up imaging is recommended. There is no urinary stone or obstruction. There is  mild generalized thickening of the bladder versus underdistention. Stomach/Bowel: No dilatation or wall thickening, including of the appendix. There is colonic diverticulosis without evidence of diverticulitis. Vascular/Lymphatic: Aortic atherosclerosis. No enlarged abdominal or pelvic lymph nodes. Reproductive: There is no prostatomegaly. There is calcification in the right hemiprostate. There are mild stranding changes in the pelvic floor alongside the prostate, nonspecific but may be seen with prostatitis. Clinical correlation advised. Other: Small umbilical and inguinal fat hernias. No incarcerated hernia. No free fluid, free hemorrhage or free air. Musculoskeletal: There is advanced L4-5 facet hypertrophy, grade 1 L4-5 degenerative spondylolisthesis and severe acquired L4-5 spinal canal stenosis. Enthesopathic changes of the pelvis are also noted as well as ankylosis across the anterior SI joints. No acute or other significant osseous findings. IMPRESSION: 1. Mildly enlarged mediastinal and hilar lymph nodes, nonspecific. 2. Emphysema and bronchitis without evidence of pneumonia. 3. 6 mm ground-glass nodule in the right lower lobe, 3 mm subpleural nodule in the left lower  lobe and 6 mm subpleural nodule in the right upper lobe. Initial follow-up with CT at 6 months is recommended to confirm persistence. If persistent, repeat CT is recommended every 2 years until 5 years of stability has been established. This recommendation follows the consensus statement: Guidelines for Management of Incidental Pulmonary Nodules Detected on CT Images: From the Fleischner Society 2017; Radiology 2017; 284:228-243. 4. 1.9 x 1.7 cm left adrenal nodule. Adrenal washout CT or chemical shift MRI recommended. 5. Cholelithiasis with dilated gallbladder but no wall thickening or bile duct dilatation. 6. Cystitis versus bladder nondistention. 7. Mild stranding changes in the pelvic floor alongside the normal-sized prostate, nonspecific  but may be seen with prostatitis. 8. Aortic and coronary artery atherosclerosis. Mild cardiomegaly. 9. Diverticulosis without evidence of diverticulitis. 10. Small umbilical and inguinal fat hernias. 11. Advanced L4-5 facet hypertrophy, grade 1 L4-5 degenerative spondylolisthesis and severe acquired L4-5 spinal canal stenosis. Aortic Atherosclerosis (ICD10-I70.0) and Emphysema (ICD10-J43.9). Electronically Signed   By: Almira Bar M.D.   On: 08/10/2023 21:37     Family Communication: Patient's wife at bedside, understand and agree with current care plan.  Disposition: Status is: Inpatient Remains inpatient appropriate because: severe electrolyte disturbances, very high BP need step down care.  Planned Discharge Destination: Home     MDM level 3- Sepsis, severe hypokalemia, very high BP requiring close monitoring in step down unit. He need IV fluids, electrolytes replacement, IV antibiotics, IV cardene drip, close neurological, telemetry, hemodynamic monitoring. He is at high risk for clinical deterioration.  Author: Marcelino Duster, MD 08/11/2023 4:27 PM  For on call review [email or web address].

## 2023-08-11 NOTE — Progress Notes (Signed)
Pt s/u on auto CPAP d/t pt's wife stating he used to wear one at home but has been broke for some time. Pt doesn't usually require O2 with his unit at home but had to bleed in 3l at this time d/t sat's only being 89-90% once in ICU and on APAP.

## 2023-08-11 NOTE — Sepsis Progress Note (Signed)
Elink monitoring for the code sepsis protocol.  

## 2023-08-12 ENCOUNTER — Inpatient Hospital Stay: Payer: BC Managed Care – PPO

## 2023-08-12 DIAGNOSIS — K8020 Calculus of gallbladder without cholecystitis without obstruction: Secondary | ICD-10-CM | POA: Diagnosis not present

## 2023-08-12 DIAGNOSIS — R7989 Other specified abnormal findings of blood chemistry: Secondary | ICD-10-CM | POA: Diagnosis not present

## 2023-08-12 DIAGNOSIS — R600 Localized edema: Secondary | ICD-10-CM | POA: Diagnosis not present

## 2023-08-12 LAB — CBC
HCT: 36.7 % — ABNORMAL LOW (ref 39.0–52.0)
Hemoglobin: 12.1 g/dL — ABNORMAL LOW (ref 13.0–17.0)
MCH: 23.2 pg — ABNORMAL LOW (ref 26.0–34.0)
MCHC: 33 g/dL (ref 30.0–36.0)
MCV: 70.3 fL — ABNORMAL LOW (ref 80.0–100.0)
Platelets: 202 10*3/uL (ref 150–400)
RBC: 5.22 MIL/uL (ref 4.22–5.81)
RDW: 14.9 % (ref 11.5–15.5)
WBC: 5.1 10*3/uL (ref 4.0–10.5)
nRBC: 0 % (ref 0.0–0.2)

## 2023-08-12 LAB — COMPREHENSIVE METABOLIC PANEL
ALT: 122 U/L — ABNORMAL HIGH (ref 0–44)
AST: 228 U/L — ABNORMAL HIGH (ref 15–41)
Albumin: 3.2 g/dL — ABNORMAL LOW (ref 3.5–5.0)
Alkaline Phosphatase: 35 U/L — ABNORMAL LOW (ref 38–126)
Anion gap: 12 (ref 5–15)
BUN: 11 mg/dL (ref 6–20)
CO2: 26 mmol/L (ref 22–32)
Calcium: 8.1 mg/dL — ABNORMAL LOW (ref 8.9–10.3)
Chloride: 105 mmol/L (ref 98–111)
Creatinine, Ser: 0.99 mg/dL (ref 0.61–1.24)
GFR, Estimated: >60 mL/min (ref >60)
Glucose, Bld: 130 mg/dL — ABNORMAL HIGH (ref 70–99)
Potassium: 3.1 mmol/L — ABNORMAL LOW (ref 3.5–5.1)
Sodium: 143 mmol/L (ref 135–145)
Total Bilirubin: 3.2 mg/dL — ABNORMAL HIGH (ref 0.3–1.2)
Total Protein: 6.1 g/dL — ABNORMAL LOW (ref 6.5–8.1)

## 2023-08-12 LAB — MAGNESIUM: Magnesium: 1.6 mg/dL — ABNORMAL LOW (ref 1.7–2.4)

## 2023-08-12 LAB — LACTIC ACID, PLASMA: Lactic Acid, Venous: 1.3 mmol/L (ref 0.5–1.9)

## 2023-08-12 LAB — PHOSPHORUS: Phosphorus: 3.2 mg/dL (ref 2.5–4.6)

## 2023-08-12 MED ORDER — ENOXAPARIN SODIUM 60 MG/0.6ML IJ SOSY
55.0000 mg | PREFILLED_SYRINGE | Freq: Every evening | INTRAMUSCULAR | Status: DC
  Administered 2023-08-12 – 2023-08-14 (×3): 55 mg via SUBCUTANEOUS
  Filled 2023-08-12 (×4): qty 0.6

## 2023-08-12 MED ORDER — MAGNESIUM SULFATE 4 GM/100ML IV SOLN
4.0000 g | Freq: Once | INTRAVENOUS | Status: AC
  Administered 2023-08-12: 4 g via INTRAVENOUS
  Filled 2023-08-12: qty 100

## 2023-08-12 MED ORDER — POTASSIUM CHLORIDE CRYS ER 20 MEQ PO TBCR
40.0000 meq | EXTENDED_RELEASE_TABLET | Freq: Three times a day (TID) | ORAL | Status: AC
  Administered 2023-08-12 (×2): 40 meq via ORAL
  Filled 2023-08-12 (×2): qty 2

## 2023-08-12 MED ORDER — ORAL CARE MOUTH RINSE: 15.0000 mL | OROMUCOSAL | Status: DC | PRN

## 2023-08-12 MED ORDER — PIPERACILLIN-TAZOBACTAM 3.375 G IVPB
3.3750 g | Freq: Three times a day (TID) | INTRAVENOUS | Status: DC
  Administered 2023-08-12 – 2023-08-13 (×2): 3.375 g via INTRAVENOUS
  Filled 2023-08-12 (×2): qty 50

## 2023-08-12 NOTE — Plan of Care (Signed)
  Problem: Education: Goal: Ability to describe self-care measures that may prevent or decrease complications (Diabetes Survival Skills Education) will improve Outcome: Progressing   Problem: Coping: Goal: Ability to adjust to condition or change in health will improve Outcome: Progressing   Problem: Fluid Volume: Goal: Ability to maintain a balanced intake and output will improve Outcome: Progressing   Problem: Health Behavior/Discharge Planning: Goal: Ability to identify and utilize available resources and services will improve Outcome: Progressing   Problem: Metabolic: Goal: Ability to maintain appropriate glucose levels will improve Outcome: Progressing   Problem: Skin Integrity: Goal: Risk for impaired skin integrity will decrease Outcome: Progressing   Problem: Education: Goal: Knowledge of General Education information will improve Description: Including pain rating scale, medication(s)/side effects and non-pharmacologic comfort measures Outcome: Progressing   Problem: Clinical Measurements: Goal: Will remain free from infection Outcome: Progressing Goal: Diagnostic test results will improve Outcome: Progressing Goal: Respiratory complications will improve Outcome: Progressing

## 2023-08-12 NOTE — Progress Notes (Signed)
[facility] Progress Note  Patient: Logan Harrell    WUX:[id number]  DOA: 08/08/2023     Date of Service: the patient was seen and examined on 08/12/2023  Chief Complaint  Patient presents with   Leg Swelling   Brief hospital course: Gannicus Luers is a 57 y.o. male with medical history significant for diabetes mellitus type 2, hypertension, obesity, sleep apnea presenting with leg swelling that is been going on for for the past 2 months or so over the past 2 weeks.  Patient also presenting with intermittent abdominal pain since January that is worse sometimes with eating sometimes unrelated to eating patient's appetite has decreased due to this and is not able to eat a full meal because of nausea and vomiting.  Patient also report weight loss. Patient is taking mifepristone (steroids for high blood sugars) no history of Cushing syndrome though.  Is admitted to the hospitalist service with impression of bilateral leg edema rule out CHF, elevated troponin, severe hypokalemia and and cannot hypertension, abnormal LFTs.  Mifepristone is stopped as it can cause severe hypokalemia, prolonged QTc.  GI evaluated him planned for endoscopy, but due to high BP, low K its postponed. Due to elevated BP, very low K not responding to IV and oral supplements, I transferred him to stepdown, started cardene drip. He is also seen by surgery team for elevated LFT, cholelithiasis. HIDA Scan done today shows no common or cystic duct obstruction. No surgical intervention. Post HIDA, he was noted to have fever, tachycardia, hypoxia. Sepsis protocol initiated.    Assessment and Plan:  Cholelithiasis without obstruction Elevated LFTs RUQ uls showed choledocholithiasis, no cholecystitis. GGT wnl. Non obstructive etiology on Hida scan. General surgery recommended no intervention. Intermittent abdominal pain, nausea, vomiting with weight loss. GI advised endoscopy, unable to schedule due to elevated BP, low K. Diet  advanced to low-fat diet Acute hep panel negative. Hold statin. Continue to trend LFT.   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 Will get nephrology eval for resistant hypertension. Transfer to stepdown unit for Cardene drip titration. Continue lisinopril to 40, aldactone 50, increase metoprolol to 50 and PRN hydralazine.  8/23 s/p Cardene IV infusion, stopped around 11 AM today   Sepsis unknown origin. Fever, tachycardia, tachypnea, hypoxia noted. Started IV fluids per sepsis protocol. Blood cultures sent. Urinalysis with reflex sent. Chest xray ordered. S/p IV Cefepime, flagyl, Vanco  8/23 de-escalated to Zosyn as per pharmacy    Elevated troponin QTc prolonged at 544. Mifepristone can cause elevated Qtc, will continue to hold it. BNP of 477 and troponin of 82 flat Echo unremarkable, he reports no chest pain. Continue to follow and replace electrolytes.   Severe Hypokalemia Due to GI losses vs Mifepristone adverse effect vs primary aldosteronism Continue to replace IV and oral potassium per Pharmacy protocol.   Hypomagnesemia, mag completed. 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 Aldosterone/ renin level pending. I discussed with [facility] (Dr. Lum Babe) regarding his symptoms, labs, CT finding of left adrenal nodule. She suggested further outpatient work up, once discharged her office will call him to schedule appointment. 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    History of type 2 diabetes mellitus A1C 5.8, no need for accucheks. Hypoglycemia protocol as he is eating poor.  Bilateral leg edema, edema improved Unlikely  CHF. Encouraged leg elevation. Echo showed normal EF. Avoid hepatotoxic drugs. Strict I/O and daily weights.      Body mass index is 38.38 kg/m.  Nutrition Problem: Inadequate oral intake Etiology: altered GI function Interventions: Interventions: Refer to RD note for recommendations   Diet: Heart healthy diet DVT Prophylaxis: Subcutaneous Lovenox   Advance goals of care discussion: Full code  Family Communication: family was not present at bedside, at the time of interview.  The pt provided permission to discuss medical plan with the family. Opportunity was given to ask question and all questions were answered satisfactorily.   Disposition:  Status is: Inpatient Remains inpatient appropriate because: severe electrolyte disturbances, very high BP need step down care.  Planned Discharge Destination: Home   Subjective: No significant events overnight, patient denies any pain, had 2 episodes of diarrhea.  Denies any chest pain or palpitations, shortness of today.   Physical Exam: General: NAD, lying comfortably Appear in no distress, affect appropriate Eyes: PERRLA ENT: Oral Mucosa Clear, moist  Neck: no JVD,  Cardiovascular: S1 and S2 Present, no Murmur,  Respiratory: good respiratory effort, Bilateral Air entry equal and Decreased, no Crackles, no wheezes Abdomen: Bowel Sound present, Soft and no tenderness,  Skin: no rashes Extremities: no Pedal edema, no calf tenderness Neurologic: without any new focal findings Gait not checked due to patient safety concerns  Vitals:   08/12/23 1618 08/12/23 1700 08/12/23 1715 08/12/23 1800  BP:   (!) 151/74 (!) 150/73  Pulse:  79 78 77  Resp:  10 16 (!) 23  Temp:      TempSrc:      SpO2: 95% 92% 91% (!) 89%  Weight:      Height:        Intake/Output Summary (Last 24 hours) at 08/12/2023 1815 Last data filed at 08/12/2023 1700 Gross per 24 hour  Intake 8408.38 ml  Output 575 ml  Net 7833.38 ml   Filed Weights   08/10/23 0416 08/11/23 0500 08/11/23 1745  Weight: 113 kg 115.3 kg 114.5 kg    Data Reviewed: I have personally  reviewed and interpreted daily labs, tele strips, imagings as discussed above. I reviewed all nursing notes, pharmacy notes, vitals, pertinent old records I have discussed plan of care as described above with RN and patient/family.  CBC: Recent Labs  Lab 08/08/23 1329 08/10/23 0428 08/11/23 0430 08/11/23 1908 08/12/23 0441  WBC 6.5 6.7 5.4 7.7 5.1  NEUTROABS  --   --   --  6.7  --   HGB 12.3* 13.1 12.5* 13.3 12.1*  HCT 37.3* 39.7 38.6* 40.1 36.7*  MCV 70.2* 71.0* 69.8* 70.4* 70.3*  PLT 261 279 251 244 202   Basic Metabolic Panel: Recent Labs  Lab 08/08/23 1329 08/08/23 2108 08/09/23 0445 08/09/23 1836 08/10/23 0428 08/10/23 1802 08/11/23 0430 08/11/23 1300 08/11/23 1908 08/12/23 0026 08/12/23 0441  NA 138  --  144  --  140  --  137  --  138  --  143  K <2.0*   < > 2.1*   < > 2.3* 2.5* 2.4* 2.7* 2.5*  --  3.1*  CL 97*  --  103  --  100  --  97*  --  98  --  105  CO2 31  --  29  --  29  --  28  --  26  --  26  GLUCOSE 110*  --  102*  --  118*  --  114*  --  140*  --  130*  BUN 6  --  6  --  6  --  6  --  7  --  11  CREATININE 0.86  --  0.73  --  0.70  --  0.75  --  0.85  --  0.99  CALCIUM 7.9*  --  7.9*  --  8.0*  --  8.2*  --  8.1*  --  8.1*  MG 2.0  --  2.0  --  2.0  --   --   --   --  1.6*  --   PHOS  --   --  2.7  --  3.1  --   --   --   --   --  3.2   < > = values in this interval not displayed.    Studies: DG UGI W SINGLE CM (SOL OR THIN BA)  Result Date: 08/12/2023 CLINICAL DATA:  [id number] Nausea AND vomiting [id number]. EXAM: WATER SOLUBLE UPPER GI SERIES TECHNIQUE: Single-column upper GI series was performed using water soluble contrast. CONTRAST:  Omnipaque 300. COMPARISON:  Chest radiograph 08/11/2023. CT chest/abdomen/pelvis 08/10/2023. FLUOROSCOPY: Fluoroscopy Time:  30 seconds. Radiation Exposure Index (if provided by the fluoroscopic device): 16.5 mGy reference air kerma. Number of Acquired Spot Images: 6 FINDINGS: Esophagus: Normal appearance.  No obstruction.  Esophageal motility: Within normal limits. Gastroesophageal reflux: None visualized. Stomach: Normal appearance. No hiatal hernia. Gastric emptying: Normal. Duodenum: Normal appearance. Other:  None. IMPRESSION: Normal single contrast upper GI series. Electronically Signed   By: Orvan Falconer M.D.   On: 08/12/2023 09:18   DG Chest Port 1 View  Result Date: 08/11/2023 CLINICAL DATA:  Hypoxia EXAM: PORTABLE CHEST 1 VIEW COMPARISON:  08/08/2023 FINDINGS: Cardiomegaly, vascular congestion. No confluent opacities, effusions or edema. No acute bony abnormality. IMPRESSION: Cardiomegaly, vascular congestion. Electronically Signed   By: Charlett Nose M.D.   On: 08/11/2023 22:18    Scheduled Meds:  (feeding supplement) PROSource Plus  30 mL Oral BID BM   aspirin EC  81 mg Oral Daily   Chlorhexidine Gluconate Cloth  6 each Topical Daily   feeding supplement  1 Container Oral TID BM   lisinopril  40 mg Oral Daily   LORazepam  1 mg Intravenous Once   melatonin  2.5 mg Oral QHS   metoprolol tartrate  50 mg Oral BID   multivitamin with minerals  1 tablet Oral Daily   pantoprazole (PROTONIX) IV  40 mg Intravenous Q12H   sodium chloride flush  3 mL Intravenous Q12H   spironolactone  50 mg Oral Daily   Continuous Infusions:  niCARDipine Stopped (08/12/23 1104)   piperacillin-tazobactam (ZOSYN)  IV     PRN Meds: acetaminophen **OR** acetaminophen, hydrALAZINE, LORazepam, metoprolol tartrate, morphine injection, ondansetron **OR** ondansetron (ZOFRAN) IV, mouth rinse  Time spent: 35 minutes  Author: Gillis Santa. MD [facility] 08/12/2023 6:15 PM  To reach On-call, see care teams to locate the attending and reach out to them via [email or web address]. If 7PM-7AM, please contact night-coverage If you still have difficulty reaching the attending provider, please page the [facility] (Director on Call) for [facility] on amion for assistance.

## 2023-08-12 NOTE — Progress Notes (Signed)
Anticoagulation monitoring(Lovenox):  57yo  M ordered Lovenox 40 mg Q24h    Filed Weights   08/10/23 0416 08/11/23 0500 08/11/23 1745  Weight: 113 kg (249 lb 1.9 oz) 115.3 kg (254 lb 3.1 oz) 114.5 kg (252 lb 6.8 oz)   BMI 38.3   Lab Results  Component Value Date   CREATININE 0.99 08/12/2023   CREATININE 0.85 08/11/2023   CREATININE 0.75 08/11/2023   Estimated Creatinine Clearance: 98.6 mL/min (by C-G formula based on SCr of 0.99 mg/dL). Hemoglobin & Hematocrit     Component Value Date/Time   HGB 12.1 (L) 08/12/2023 0441   HCT 36.7 (L) 08/12/2023 0441     Per Protocol for Patient with estCrcl > 30 ml/min and BMI > 30, will transition to Lovenox 0.5 mg/kg Q24h      Bari Mantis PharmD Clinical Pharmacist 08/12/2023

## 2023-08-12 NOTE — Progress Notes (Signed)
PHARMACY CONSULT NOTE - ELECTROLYTES  Pharmacy Consult for Electrolyte Monitoring and Replacement   Recent Labs: Height: 5\' 8"  (172.7 cm) Weight: 114.5 kg (252 lb 6.8 oz) IBW/kg (Calculated) : 68.4 Estimated Creatinine Clearance: 98.6 mL/min (by C-G formula based on SCr of 0.99 mg/dL). Potassium (mmol/L)  Date Value  08/12/2023 3.1 (L)   Magnesium (mg/dL)  Date Value  40/98/1191 1.6 (L)   Calcium (mg/dL)  Date Value  47/82/9562 8.1 (L)   Albumin (g/dL)  Date Value  13/07/6577 3.2 (L)   Phosphorus (mg/dL)  Date Value  46/96/2952 3.1   Sodium (mmol/L)  Date Value  08/12/2023 143   Assessment  Logan Harrell is a 57 y.o. male presenting with leg swelling, hypokalemia. PMH significant for DM2, HTN, HLD . Pharmacy has been consulted to monitor and replace electrolytes.  Diet: CLD  MIVF: N/A Pertinent medications: Aldactone 25 mg daily, Kcl 40 mEq TID scheduled  Goal of Therapy: Electrolytes WNL  Plan:  Kcl PO x 2 Maf sulfate 4g IV x 1 F/u with AM labs.   Thank you for allowing pharmacy to be a part of this patient's care.  Bettey Costa, PharmD Clinical Pharmacist 08/12/2023 8:00 AM

## 2023-08-12 NOTE — Progress Notes (Signed)
08/12/2023  Subjective: Patient was transferred to ICU yesterday for further management of his hypertension.  He did have his HIDA scan yesterday which showed a filling gallbladder with a patent cystic duct and common bile duct.  This rules out cholecystitis.  Patient denies any abdominal pain but feels hungry.  Vital signs: Temp:  [98.2 F (36.8 C)-101.8 F (38.8 C)] 98.9 F (37.2 C) (08/23 0800) Pulse Rate:  [54-123] 92 (08/23 0815) Resp:  [0-37] 22 (08/23 0815) BP: (102-225)/(47-105) 161/68 (08/23 0815) SpO2:  [48 %-99 %] 93 % (08/23 0815) Weight:  [114.5 kg] 114.5 kg (08/22 1745)   Intake/Output: 08/22 0701 - 08/23 0700 In: 1610.9 [I.V.:2012.5; IV Piggyback:3795.7] Out: 825 [Urine:825] Last BM Date : 08/08/23  Physical Exam: Constitutional: No acute distress Abdomen: Soft, nondistended, nontender to palpation.  Labs:  Recent Labs    08/11/23 1908 08/12/23 0441  WBC 7.7 5.1  HGB 13.3 12.1*  HCT 40.1 36.7*  PLT 244 202   Recent Labs    08/11/23 1908 08/12/23 0441  NA 138 143  K 2.5* 3.1*  CL 98 105  CO2 26 26  GLUCOSE 140* 130*  BUN 7 11  CREATININE 0.85 0.99  CALCIUM 8.1* 8.1*   Recent Labs    08/11/23 0430  LABPROT 14.5  INR 1.1    Imaging: DG Chest Port 1 View  Result Date: 08/11/2023 CLINICAL DATA:  Hypoxia EXAM: PORTABLE CHEST 1 VIEW COMPARISON:  08/08/2023 FINDINGS: Cardiomegaly, vascular congestion. No confluent opacities, effusions or edema. No acute bony abnormality. IMPRESSION: Cardiomegaly, vascular congestion. Electronically Signed   By: Charlett Nose M.D.   On: 08/11/2023 22:18   NM Hepatobiliary Liver Func  Result Date: 08/11/2023 CLINICAL DATA:  Cholelithiasis EXAM: NUCLEAR MEDICINE HEPATOBILIARY IMAGING TECHNIQUE: Sequential images of the abdomen were obtained out to 60 minutes following intravenous administration of radiopharmaceutical. RADIOPHARMACEUTICALS:  7.67 mCi Tc-79m  Choletec IV COMPARISON:  CT 08/10/2023.  Ultrasound abdomen  08/08/2023 FINDINGS: Prompt uptake and biliary excretion of activity by the liver is seen. Gallbladder activity is visualized by 60 minutes proximally, consistent with patency of cystic duct. Biliary activity passes into small bowel, consistent with patent common bile duct. IMPRESSION: No common duct or cystic duct obstruction. Electronically Signed   By: Karen Kays M.D.   On: 08/11/2023 17:42    Assessment/Plan: This is a 57 y.o. male with elevated LFTs.  - Discussed with patient all although she does have cholelithiasis as noted on his ultrasound on 08/08/2023, HIDA scan yesterday rules out cholecystitis.  Cholelithiasis alone would not explain elevated LFTs particularly unconjugated hyperbilirubinemia.  Will defer to GI for further management of this. - From surgical standpoint, no acute surgical needs at this point.  Diet can be advanced as tolerated.  He may follow-up with me as an outpatient to discuss role of elective cholecystectomy in the future. 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  The patient's wife has asked to be referred to [facility] if that is needed.   I spent 35 minutes dedicated to the care of this patient on the date of this encounter to include pre-visit review of records, face-to-face time with the patient discussing diagnosis and management, and any post-visit coordination of care.  Howie Ill, MD [facility]

## 2023-08-13 DIAGNOSIS — R7989 Other specified abnormal findings of blood chemistry: Secondary | ICD-10-CM | POA: Diagnosis not present

## 2023-08-13 DIAGNOSIS — E876 Hypokalemia: Secondary | ICD-10-CM | POA: Diagnosis not present

## 2023-08-13 DIAGNOSIS — Z8679 Personal history of other diseases of the circulatory system: Secondary | ICD-10-CM | POA: Diagnosis not present

## 2023-08-13 DIAGNOSIS — R600 Localized edema: Secondary | ICD-10-CM | POA: Diagnosis not present

## 2023-08-13 LAB — HEPATIC FUNCTION PANEL
ALT: 127 U/L — ABNORMAL HIGH (ref 0–44)
AST: 252 U/L — ABNORMAL HIGH (ref 15–41)
Albumin: 3 g/dL — ABNORMAL LOW (ref 3.5–5.0)
Alkaline Phosphatase: 33 U/L — ABNORMAL LOW (ref 38–126)
Bilirubin, Direct: 0.3 mg/dL — ABNORMAL HIGH (ref 0.0–0.2)
Indirect Bilirubin: 0.9 mg/dL (ref 0.3–0.9)
Total Bilirubin: 1.2 mg/dL (ref 0.3–1.2)
Total Protein: 5.9 g/dL — ABNORMAL LOW (ref 6.5–8.1)

## 2023-08-13 LAB — BASIC METABOLIC PANEL
Anion gap: 9 (ref 5–15)
BUN: 10 mg/dL (ref 6–20)
CO2: 26 mmol/L (ref 22–32)
Calcium: 7.9 mg/dL — ABNORMAL LOW (ref 8.9–10.3)
Chloride: 108 mmol/L (ref 98–111)
Creatinine, Ser: 0.72 mg/dL (ref 0.61–1.24)
GFR, Estimated: >60 mL/min (ref >60)
Glucose, Bld: 118 mg/dL — ABNORMAL HIGH (ref 70–99)
Potassium: 3 mmol/L — ABNORMAL LOW (ref 3.5–5.1)
Sodium: 143 mmol/L (ref 135–145)

## 2023-08-13 LAB — MAGNESIUM: Magnesium: 2 mg/dL (ref 1.7–2.4)

## 2023-08-13 LAB — CBC
HCT: 36 % — ABNORMAL LOW (ref 39.0–52.0)
Hemoglobin: 11.8 g/dL — ABNORMAL LOW (ref 13.0–17.0)
MCH: 23.1 pg — ABNORMAL LOW (ref 26.0–34.0)
MCHC: 32.8 g/dL (ref 30.0–36.0)
MCV: 70.5 fL — ABNORMAL LOW (ref 80.0–100.0)
Platelets: 207 10*3/uL (ref 150–400)
RBC: 5.11 MIL/uL (ref 4.22–5.81)
RDW: 14.9 % (ref 11.5–15.5)
WBC: 5.1 10*3/uL (ref 4.0–10.5)
nRBC: 0 % (ref 0.0–0.2)

## 2023-08-13 LAB — GLUCOSE, CAPILLARY: Glucose-Capillary: 177 mg/dL — ABNORMAL HIGH (ref 70–99)

## 2023-08-13 LAB — PHOSPHORUS: Phosphorus: 2.6 mg/dL (ref 2.5–4.6)

## 2023-08-13 MED ORDER — CARVEDILOL 12.5 MG PO TABS
12.5000 mg | ORAL_TABLET | Freq: Two times a day (BID) | ORAL | Status: DC
  Administered 2023-08-13 – 2023-08-15 (×4): 12.5 mg via ORAL
  Filled 2023-08-13 (×4): qty 1

## 2023-08-13 MED ORDER — POTASSIUM CHLORIDE CRYS ER 20 MEQ PO TBCR
40.0000 meq | EXTENDED_RELEASE_TABLET | Freq: Three times a day (TID) | ORAL | Status: AC
  Administered 2023-08-13 (×2): 40 meq via ORAL
  Filled 2023-08-13 (×2): qty 2

## 2023-08-13 MED ORDER — SODIUM CHLORIDE 0.9 % IV SOLN
2.0000 g | INTRAVENOUS | Status: DC
  Administered 2023-08-13: 2 g via INTRAVENOUS
  Filled 2023-08-13 (×3): qty 20

## 2023-08-13 MED ORDER — SPIRONOLACTONE 25 MG PO TABS
100.0000 mg | ORAL_TABLET | Freq: Every day | ORAL | Status: DC
  Administered 2023-08-13 – 2023-08-15 (×3): 100 mg via ORAL
  Filled 2023-08-13 (×3): qty 4

## 2023-08-13 NOTE — Plan of Care (Signed)
  Problem: Education: Goal: Ability to describe self-care measures that may prevent or decrease complications (Diabetes Survival Skills Education) will improve Outcome: Progressing Goal: Individualized Educational Video(s) Outcome: Progressing   Problem: Coping: Goal: Ability to adjust to condition or change in health will improve Outcome: Progressing   Problem: Fluid Volume: Goal: Ability to maintain a balanced intake and output will improve Outcome: Progressing   Problem: Health Behavior/Discharge Planning: Goal: Ability to identify and utilize available resources and services will improve Outcome: Progressing Goal: Ability to manage health-related needs will improve Outcome: Progressing   Problem: Metabolic: Goal: Ability to maintain appropriate glucose levels will improve Outcome: Progressing   Problem: Nutritional: Goal: Maintenance of adequate nutrition will improve Outcome: Progressing Goal: Progress toward achieving an optimal weight will improve Outcome: Progressing   Problem: Skin Integrity: Goal: Risk for impaired skin integrity will decrease Outcome: Progressing   Problem: Tissue Perfusion: Goal: Adequacy of tissue perfusion will improve Outcome: Progressing   Problem: Education: Goal: Knowledge of General Education information will improve Description: Including pain rating scale, medication(s)/side effects and non-pharmacologic comfort measures Outcome: Progressing   Problem: Health Behavior/Discharge Planning: Goal: Ability to manage health-related needs will improve Outcome: Progressing   Problem: Clinical Measurements: Goal: Ability to maintain clinical measurements within normal limits will improve Outcome: Progressing Goal: Will remain free from infection Outcome: Progressing Goal: Diagnostic test results will improve Outcome: Progressing Goal: Respiratory complications will improve Outcome: Progressing Goal: Cardiovascular complication will  be avoided Outcome: Progressing   Problem: Activity: Goal: Risk for activity intolerance will decrease Outcome: Progressing   Problem: Nutrition: Goal: Adequate nutrition will be maintained Outcome: Progressing   Problem: Coping: Goal: Level of anxiety will decrease Outcome: Progressing   Problem: Elimination: Goal: Will not experience complications related to bowel motility Outcome: Progressing Goal: Will not experience complications related to urinary retention Outcome: Progressing   Problem: Pain Managment: Goal: General experience of comfort will improve Outcome: Progressing   Problem: Safety: Goal: Ability to remain free from injury will improve Outcome: Progressing   Problem: Skin Integrity: Goal: Risk for impaired skin integrity will decrease Outcome: Progressing   Problem: Fluid Volume: Goal: Hemodynamic stability will improve Outcome: Progressing   Problem: Clinical Measurements: Goal: Diagnostic test results will improve Outcome: Progressing Goal: Signs and symptoms of infection will decrease Outcome: Progressing   Problem: Respiratory: Goal: Ability to maintain adequate ventilation will improve Outcome: Progressing   

## 2023-08-13 NOTE — Plan of Care (Signed)
  Problem: Coping: Goal: Ability to adjust to condition or change in health will improve Outcome: Progressing   Problem: Fluid Volume: Goal: Ability to maintain a balanced intake and output will improve Outcome: Progressing   Problem: Health Behavior/Discharge Planning: Goal: Ability to manage health-related needs will improve Outcome: Progressing   Problem: Metabolic: Goal: Ability to maintain appropriate glucose levels will improve Outcome: Progressing   Problem: Skin Integrity: Goal: Risk for impaired skin integrity will decrease Outcome: Progressing   Problem: Tissue Perfusion: Goal: Adequacy of tissue perfusion will improve Outcome: Progressing   Problem: Health Behavior/Discharge Planning: Goal: Ability to manage health-related needs will improve Outcome: Progressing   Problem: Clinical Measurements: Goal: Will remain free from infection Outcome: Progressing Goal: Respiratory complications will improve Outcome: Progressing   Problem: Nutrition: Goal: Adequate nutrition will be maintained Outcome: Progressing   Problem: Safety: Goal: Ability to remain free from injury will improve Outcome: Progressing

## 2023-08-13 NOTE — Progress Notes (Signed)
PROGRESS NOTE  Logan Harrell ZOX:[id number] DOB: [birth year]   PCP: Patient, No Pcp Per  Patient is from: Home.  Lives with his wife.  Independently ambulates at baseline.  DOA: 08/08/2023 LOS: 4  Chief complaints Chief Complaint  Patient presents with   Leg Swelling     Brief Narrative / Interim history: 57 year old M with PMH of DM-2, HTN, obesity and OSA presenting with increased bilateral leg swelling over 2 months more so for 2 weeks, intermittent abdominal pain, decreased p.o. intake, nausea, vomiting and weight loss, and admitted for uncontrolled hypertension, hypokalemia, elevated liver enzymes, elevated troponin and bilateral lower extremity edema. Patient is taking mifepristone (steroids for high blood sugars) no history of Cushing syndrome though.  His blood pressure was not responding to p.o. and as needed IV meds.  He was transferred to stepdown and started on Cardene drip.  Workup concerning for hyperaldosteronism.  He also had elevated LFT.  Imaging showed cholelithiasis but HIDA scan negative.  He was empirically started on IV Zosyn for possible infection.   Patient's blood pressure and hypokalemia improved with up titration of Aldactone.  Antibiotic de-escalated to IV ceftriaxone.   Subjective: Seen and examined earlier this morning.  No major events overnight of this morning.  No complaints other than some intermittent productive cough with yellowish phlegm.  Denies hemoptysis.  Denies GI or UTI symptoms.   Objective: Vitals:   08/13/23 0900 08/13/23 1000 08/13/23 1015 08/13/23 1053  BP: 97/75  (!) 164/79 (!) 171/78  Pulse: 75 73 72 78  Resp: (!) 24 (!) 23 18 16   Temp:    98.9 F (37.2 C)  TempSrc:      SpO2: 99% 92% 97% 96%  Weight:      Height:        Examination:  GENERAL: No apparent distress.  Nontoxic. HEENT: MMM.  Vision and hearing grossly intact.  NECK: Supple.  No apparent JVD.  RESP:  No IWOB.  Fair aeration bilaterally. CVS:  RRR. Heart sounds  normal.  ABD/GI/GU: BS+. Abd soft, NTND but limited exam due to body habitus.  MSK/EXT:  Moves extremities. No apparent deformity. No edema.  SKIN: no apparent skin lesion or wound NEURO: Awake, alert and oriented appropriately.  No apparent focal neuro deficit. PSYCH: Calm. Normal affect.  Procedures:  None  Microbiology summarized: Blood cultures NGTD MRSA PCR screen nonreactive  Assessment and plan: Principal Problem:   Bilateral leg edema Active Problems:   History of type 2 diabetes mellitus   History of hypertension   Elevated LFTs   Cholelithiasis without obstruction   Hypokalemia   Elevated troponin   Malignant hypertension   Primary aldosteronism (HCC)   Obesity (BMI 30-39.9)   Cholelithiasis without obstruction/elevated LFT-HIDA scan negative.  General surgery recommended no intervention.  GI advised endoscopy but unable to schedule due to elevated BP and low potassium. -Continue low-fat diet -Monitor LFT -Continue holding statin   Uncontrolled malignant hypertension: With persistent hypokalemia and elevated sodium, concern for primary hyperaldosteronism.  Serum aldosterone level pending.  Off Cardene drip as of 8/23 -Continue lisinopril 40 mg daily -Increase Aldactone to 100 mg daily -Changed metoprolol to Coreg for better blood pressure control -Continue hydralazine as needed -Outpatient follow-up with endocrinology-see below   SIRS: With fever, tachycardia, tachypnea.  No clear source of infection but some productive cough.  Lung exam basically normal.  Has no leukocytosis or fever either.  UA with bacteria but patient denies UTI symptoms. -De-escalate antibiotics to IV ceftriaxone  Elevated troponin: Likely demand ischemia from uncontrolled hypertension.  TTE without significant finding. -Manage hypertension as above   Severe Hypokalemia/hypomagnesemia: Likely due to hyperaldosteronism.  Possibly GI loss -Monitor replenish as appropriate -Increase to  spironolactone.   Hypomagnesemia, mag completed. 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  Previous provider discussed with [facility] endocrine (Dr. Lum Babe) regarding his symptoms, labs, CT finding of left adrenal nodule. She suggested further outpatient work up, once discharged her office will call him to schedule appointment. 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   -Continue increased aldosterone as above -Follow aldosterone level   History of DM-2 with hyperglycemia: A1c 5.8%. Recent Labs  Lab 08/10/23 2031 08/11/23 0806 08/11/23 1235 08/11/23 1702 08/13/23 1152  GLUCAP 110* 127* 131* 127* 177*  -Continue current insulin regimen   Bilateral leg edema, edema improved: Likely from uncontrolled hypertension.  Appears euvolemic on exam.  TTE without significant finding. -Manage hypertension as above  Prolonged QT -Minimize QT prolonging drugs -Optimize electrolytes  Morbid obesity Body mass index is 38.38 kg/m. Nutrition Problem: Inadequate oral intake Etiology: altered GI function Signs/Symptoms: NPO status Interventions: Refer to RD note for recommendations   DVT prophylaxis:  On subcu Lovenox  Code Status: Full code Family Communication: Dated patient's wife at bedside Level of care: Telemetry Medical Status is: Inpatient Remains inpatient appropriate because: Uncontrolled hypertension, refractory hypokalemia and elevated LFT   Final disposition: Home Consultants:  Gastroenterology General surgery Endocrinology  55 minutes with more than 50% spent in reviewing records, counseling patient/family and coordinating care.   Sch Meds:  Scheduled Meds:  (feeding supplement) PROSource Plus  30 mL Oral BID BM   aspirin EC  81 mg Oral Daily    carvedilol  12.5 mg Oral BID WC   Chlorhexidine Gluconate Cloth  6 each Topical Daily   enoxaparin (LOVENOX) injection  55 mg Subcutaneous QPM   feeding supplement  1 Container Oral TID BM   lisinopril  40 mg Oral Daily   LORazepam  1 mg Intravenous Once   melatonin  2.5 mg Oral QHS   multivitamin with minerals  1 tablet Oral Daily   pantoprazole (PROTONIX) IV  40 mg Intravenous Q12H   potassium chloride  40 mEq Oral TID   sodium chloride flush  3 mL Intravenous Q12H   spironolactone  100 mg Oral Daily   Continuous Infusions:  cefTRIAXone (ROCEPHIN)  IV     PRN Meds:.acetaminophen **OR** acetaminophen, hydrALAZINE, LORazepam, metoprolol tartrate, morphine injection, ondansetron **OR** ondansetron (ZOFRAN) IV, mouth rinse  Antimicrobials: Anti-infectives (From admission, onward)    Start     Dose/Rate Route Frequency Ordered Stop   08/13/23 1400  cefTRIAXone (ROCEPHIN) 2 g in sodium chloride 0.9 % 100 mL IVPB        2 g 200 mL/hr over 30 Minutes Intravenous Every 24 hours 08/13/23 0838 08/16/23 1359   08/12/23 2000  piperacillin-tazobactam (ZOSYN) IVPB 3.375 g  Status:  Discontinued        3.375 g 12.5 mL/hr over 240 Minutes Intravenous Every 8 hours 08/12/23 1538 08/13/23 0838   08/12/23 0800  vancomycin (VANCOREADY) IVPB 1250 mg/250 mL  Status:  Discontinued        1,250 mg 166.7 mL/hr over 90 Minutes Intravenous Every 24 hours 08/11/23 1938 08/12/23 1538  08/12/23 0300  ceFEPIme (MAXIPIME) 2 g in sodium chloride 0.9 % 100 mL IVPB  Status:  Discontinued        2 g 200 mL/hr over 30 Minutes Intravenous Every 8 hours 08/11/23 1938 08/12/23 1538   08/11/23 1945  vancomycin (VANCOREADY) IVPB 2000 mg/400 mL        2,000 mg 200 mL/hr over 120 Minutes Intravenous  Once 08/11/23 1847 08/12/23 0030   08/11/23 1915  ceFEPIme (MAXIPIME) 2 g in sodium chloride 0.9 % 100 mL IVPB        2 g 200 mL/hr over 30 Minutes Intravenous  Once 08/11/23 1823 08/11/23 1959   08/11/23 1915   metroNIDAZOLE (FLAGYL) IVPB 500 mg  Status:  Discontinued        500 mg 100 mL/hr over 60 Minutes Intravenous Every 12 hours 08/11/23 1823 08/12/23 1538   08/11/23 1915  vancomycin (VANCOCIN) IVPB 1000 mg/200 mL premix  Status:  Discontinued        1,000 mg 200 mL/hr over 60 Minutes Intravenous  Once 08/11/23 1823 08/11/23 1847        I have personally reviewed the following labs and images: CBC: Recent Labs  Lab 08/10/23 0428 08/11/23 0430 08/11/23 1908 08/12/23 0441 08/13/23 0507  WBC 6.7 5.4 7.7 5.1 5.1  NEUTROABS  --   --  6.7  --   --   HGB 13.1 12.5* 13.3 12.1* 11.8*  HCT 39.7 38.6* 40.1 36.7* 36.0*  MCV 71.0* 69.8* 70.4* 70.3* 70.5*  PLT 279 251 244 202 207   BMP &GFR Recent Labs  Lab 08/08/23 1329 08/08/23 2108 08/09/23 0445 08/09/23 1836 08/10/23 0428 08/10/23 1802 08/11/23 0430 08/11/23 1300 08/11/23 1908 08/12/23 0026 08/12/23 0441 08/13/23 0507  NA 138  --  144  --  140  --  137  --  138  --  143 143  K <2.0*   < > 2.1*   < > 2.3*   < > 2.4* 2.7* 2.5*  --  3.1* 3.0*  CL 97*  --  103  --  100  --  97*  --  98  --  105 108  CO2 31  --  29  --  29  --  28  --  26  --  26 26  GLUCOSE 110*  --  102*  --  118*  --  114*  --  140*  --  130* 118*  BUN 6  --  6  --  6  --  6  --  7  --  11 10  CREATININE 0.86  --  0.73  --  0.70  --  0.75  --  0.85  --  0.99 0.72  CALCIUM 7.9*  --  7.9*  --  8.0*  --  8.2*  --  8.1*  --  8.1* 7.9*  MG 2.0  --  2.0  --  2.0  --   --   --   --  1.6*  --  2.0  PHOS  --   --  2.7  --  3.1  --   --   --   --   --  3.2 2.6   < > = values in this interval not displayed.   Estimated Creatinine Clearance: 122.1 mL/min (by C-G formula based on SCr of 0.72 mg/dL). Liver & Pancreas: Recent Labs  Lab 08/10/23 0428 08/11/23 0430 08/11/23 1908 08/12/23 0441 08/13/23 0507  AST 125* 148* 234* 228* 252*  ALT 118* 121* 136* 122* 127*  ALKPHOS 41 40 42 35* 33*  BILITOT 3.3* 3.7* 3.9* 3.2* 1.2  PROT 6.3* 6.8 6.9 6.1* 5.9*  ALBUMIN  3.4* 3.6 3.8 3.2* 3.0*   Recent Labs  Lab 08/08/23 1329  LIPASE 30   No results for input(s): "AMMONIA" in the last 168 hours. Diabetic: No results for input(s): "HGBA1C" in the last 72 hours. Recent Labs  Lab 08/10/23 2031 08/11/23 0806 08/11/23 1235 08/11/23 1702 08/13/23 1152  GLUCAP 110* 127* 131* 127* 177*   Cardiac Enzymes: No results for input(s): "CKTOTAL", "CKMB", "CKMBINDEX", "TROPONINI" in the last 168 hours. No results for input(s): "PROBNP" in the last 8760 hours. Coagulation Profile: Recent Labs  Lab 08/11/23 0430  INR 1.1   Thyroid Function Tests: No results for input(s): "TSH", "T4TOTAL", "FREET4", "T3FREE", "THYROIDAB" in the last 72 hours. Lipid Profile: No results for input(s): "CHOL", "HDL", "LDLCALC", "TRIG", "CHOLHDL", "LDLDIRECT" in the last 72 hours. Anemia Panel: No results for input(s): "VITAMINB12", "FOLATE", "FERRITIN", "TIBC", "IRON", "RETICCTPCT" in the last 72 hours. Urine analysis:    Component Value Date/Time   COLORURINE AMBER (A) 08/11/2023 2242   APPEARANCEUR CLOUDY (A) 08/11/2023 2242   LABSPEC 1.014 08/11/2023 2242   PHURINE 5.0 08/11/2023 2242   GLUCOSEU >=500 (A) 08/11/2023 2242   HGBUR MODERATE (A) 08/11/2023 2242   BILIRUBINUR NEGATIVE 08/11/2023 2242   KETONESUR 20 (A) 08/11/2023 2242   PROTEINUR 100 (A) 08/11/2023 2242   NITRITE NEGATIVE 08/11/2023 2242   LEUKOCYTESUR NEGATIVE 08/11/2023 2242   Sepsis Labs: Invalid input(s): "PROCALCITONIN", "LACTICIDVEN"  Microbiology: Recent Results (from the past 240 hour(s))  MRSA Next Gen by PCR, Nasal     Status: None   Collection Time: 08/11/23  5:17 PM   Specimen: Nasal Mucosa; Nasal Swab  Result Value Ref Range Status   MRSA by PCR Next Gen NOT DETECTED NOT DETECTED Final    Comment: (NOTE) The GeneXpert MRSA Assay (FDA approved for NASAL specimens only), is one component of a comprehensive MRSA colonization surveillance program. It is not intended to diagnose MRSA  infection nor to guide or monitor treatment for MRSA infections. Test performance is not FDA approved in patients less than 49 years old. Performed at [facility], [address] [place], [place] [address]   Culture, blood (Routine X 2) w Reflex to ID Panel     Status: None (Preliminary result)   Collection Time: 08/11/23  7:08 PM   Specimen: BLOOD  Result Value Ref Range Status   Specimen Description BLOOD LEFT ANTECUBITAL  Final   Special Requests   Final    BOTTLES DRAWN AEROBIC AND ANAEROBIC Blood Culture adequate volume   Culture   Final    NO GROWTH 2 DAYS Performed at [facility], [address] [place], [place] [address]    Report Status PENDING  Incomplete  Culture, blood (Routine X 2) w Reflex to ID Panel     Status: None (Preliminary result)   Collection Time: 08/11/23  7:08 PM   Specimen: BLOOD  Result Value Ref Range Status   Specimen Description BLOOD BLOOD LEFT HAND  Final   Special Requests   Final    BOTTLES DRAWN AEROBIC AND ANAEROBIC Blood Culture adequate volume   Culture   Final    NO GROWTH 2 DAYS Performed at [facility], [address], [place], [place] [address]    Report Status PENDING  Incomplete    Radiology Studies: No results found.  Ivone Licht T. Avedis Bevis [facility]  If 7PM-7AM, please contact night-coverage [email or web address] 08/13/2023, 12:08 PM

## 2023-08-13 NOTE — Progress Notes (Signed)
PHARMACY CONSULT NOTE - ELECTROLYTES  Pharmacy Consult for Electrolyte Monitoring and Replacement   Recent Labs: Height: 5\' 8"  (172.7 cm) Weight: 114.5 kg (252 lb 6.8 oz) IBW/kg (Calculated) : 68.4 Estimated Creatinine Clearance: 122.1 mL/min (by C-G formula based on SCr of 0.72 mg/dL). Potassium (mmol/L)  Date Value  08/13/2023 3.0 (L)   Magnesium (mg/dL)  Date Value  44/12/270 2.0   Calcium (mg/dL)  Date Value  53/66/4403 7.9 (L)   Albumin (g/dL)  Date Value  47/42/5956 3.0 (L)   Phosphorus (mg/dL)  Date Value  38/75/6433 2.6   Sodium (mmol/L)  Date Value  08/13/2023 143   Assessment  Logan Harrell is a 57 y.o. male presenting with leg swelling, hypokalemia. PMH significant for DM2, HTN, HLD . Pharmacy has been consulted to monitor and replace electrolytes.  Diet: CLD  MIVF: N/A Pertinent medications: Aldactone 100mg  daily, lisinopril 40 mg daily  Goal of Therapy: Electrolytes WNL  Plan:  K 3.0  MD ordered  Kcl PO x 2 doses F/u with AM labs.   Thank you for allowing pharmacy to be a part of this patient's care.  Angelique Blonder, PharmD Clinical Pharmacist 08/13/2023 11:35 AM

## 2023-08-14 DIAGNOSIS — R600 Localized edema: Secondary | ICD-10-CM | POA: Diagnosis not present

## 2023-08-14 LAB — BASIC METABOLIC PANEL
Anion gap: 9 (ref 5–15)
BUN: 9 mg/dL (ref 6–20)
CO2: 25 mmol/L (ref 22–32)
Calcium: 7.9 mg/dL — ABNORMAL LOW (ref 8.9–10.3)
Chloride: 102 mmol/L (ref 98–111)
Creatinine, Ser: 0.72 mg/dL (ref 0.61–1.24)
GFR, Estimated: >60 mL/min (ref >60)
Glucose, Bld: 116 mg/dL — ABNORMAL HIGH (ref 70–99)
Potassium: 2.9 mmol/L — ABNORMAL LOW (ref 3.5–5.1)
Sodium: 136 mmol/L (ref 135–145)

## 2023-08-14 LAB — HEPATIC FUNCTION PANEL
ALT: 132 U/L — ABNORMAL HIGH (ref 0–44)
AST: 230 U/L — ABNORMAL HIGH (ref 15–41)
Albumin: 3.3 g/dL — ABNORMAL LOW (ref 3.5–5.0)
Alkaline Phosphatase: 34 U/L — ABNORMAL LOW (ref 38–126)
Bilirubin, Direct: 0.2 mg/dL (ref 0.0–0.2)
Indirect Bilirubin: 0.9 mg/dL (ref 0.3–0.9)
Total Bilirubin: 1.1 mg/dL (ref 0.3–1.2)
Total Protein: 6.1 g/dL — ABNORMAL LOW (ref 6.5–8.1)

## 2023-08-14 LAB — CBC
HCT: 38.8 % — ABNORMAL LOW (ref 39.0–52.0)
Hemoglobin: 12.5 g/dL — ABNORMAL LOW (ref 13.0–17.0)
MCH: 22.8 pg — ABNORMAL LOW (ref 26.0–34.0)
MCHC: 32.2 g/dL (ref 30.0–36.0)
MCV: 70.7 fL — ABNORMAL LOW (ref 80.0–100.0)
Platelets: 219 10*3/uL (ref 150–400)
RBC: 5.49 MIL/uL (ref 4.22–5.81)
RDW: 14.8 % (ref 11.5–15.5)
WBC: 4.5 10*3/uL (ref 4.0–10.5)
nRBC: 0 % (ref 0.0–0.2)

## 2023-08-14 LAB — SARS CORONAVIRUS 2 BY RT PCR: SARS Coronavirus 2 by RT PCR: POSITIVE — AB

## 2023-08-14 LAB — GLUCOSE, CAPILLARY: Glucose-Capillary: 244 mg/dL — ABNORMAL HIGH (ref 70–99)

## 2023-08-14 LAB — PHOSPHORUS: Phosphorus: 2.3 mg/dL — ABNORMAL LOW (ref 2.5–4.6)

## 2023-08-14 LAB — MAGNESIUM: Magnesium: 1.9 mg/dL (ref 1.7–2.4)

## 2023-08-14 MED ORDER — GUAIFENESIN 100 MG/5ML PO LIQD
5.0000 mL | ORAL | Status: DC | PRN
  Filled 2023-08-14: qty 10

## 2023-08-14 MED ORDER — POTASSIUM CHLORIDE CRYS ER 20 MEQ PO TBCR
40.0000 meq | EXTENDED_RELEASE_TABLET | ORAL | Status: AC
  Administered 2023-08-14 (×3): 40 meq via ORAL
  Filled 2023-08-14 (×3): qty 2

## 2023-08-14 MED ORDER — K PHOS MONO-SOD PHOS DI & MONO 155-852-130 MG PO TABS
500.0000 mg | ORAL_TABLET | Freq: Once | ORAL | Status: AC
  Administered 2023-08-14: 500 mg via ORAL
  Filled 2023-08-14: qty 2

## 2023-08-14 NOTE — Plan of Care (Signed)
  Problem: Education: Goal: Ability to describe self-care measures that may prevent or decrease complications (Diabetes Survival Skills Education) will improve Outcome: Progressing Goal: Individualized Educational Video(s) Outcome: Progressing   Problem: Coping: Goal: Ability to adjust to condition or change in health will improve Outcome: Progressing   Problem: Fluid Volume: Goal: Ability to maintain a balanced intake and output will improve Outcome: Progressing   Problem: Health Behavior/Discharge Planning: Goal: Ability to identify and utilize available resources and services will improve Outcome: Progressing Goal: Ability to manage health-related needs will improve Outcome: Progressing   Problem: Metabolic: Goal: Ability to maintain appropriate glucose levels will improve Outcome: Progressing   Problem: Nutritional: Goal: Maintenance of adequate nutrition will improve Outcome: Progressing Goal: Progress toward achieving an optimal weight will improve Outcome: Progressing   Problem: Skin Integrity: Goal: Risk for impaired skin integrity will decrease Outcome: Progressing   Problem: Tissue Perfusion: Goal: Adequacy of tissue perfusion will improve Outcome: Progressing   Problem: Education: Goal: Knowledge of General Education information will improve Description: Including pain rating scale, medication(s)/side effects and non-pharmacologic comfort measures Outcome: Progressing   Problem: Health Behavior/Discharge Planning: Goal: Ability to manage health-related needs will improve Outcome: Progressing   Problem: Clinical Measurements: Goal: Ability to maintain clinical measurements within normal limits will improve Outcome: Progressing Goal: Will remain free from infection Outcome: Progressing Goal: Diagnostic test results will improve Outcome: Progressing Goal: Respiratory complications will improve Outcome: Progressing Goal: Cardiovascular complication will  be avoided Outcome: Progressing   Problem: Activity: Goal: Risk for activity intolerance will decrease Outcome: Progressing   Problem: Nutrition: Goal: Adequate nutrition will be maintained Outcome: Progressing   Problem: Coping: Goal: Level of anxiety will decrease Outcome: Progressing   Problem: Elimination: Goal: Will not experience complications related to bowel motility Outcome: Progressing Goal: Will not experience complications related to urinary retention Outcome: Progressing   Problem: Pain Managment: Goal: General experience of comfort will improve Outcome: Progressing   Problem: Safety: Goal: Ability to remain free from injury will improve Outcome: Progressing   Problem: Skin Integrity: Goal: Risk for impaired skin integrity will decrease Outcome: Progressing   Problem: Fluid Volume: Goal: Hemodynamic stability will improve Outcome: Progressing   Problem: Clinical Measurements: Goal: Diagnostic test results will improve Outcome: Progressing Goal: Signs and symptoms of infection will decrease Outcome: Progressing   Problem: Respiratory: Goal: Ability to maintain adequate ventilation will improve Outcome: Progressing   

## 2023-08-14 NOTE — Progress Notes (Signed)
PROGRESS NOTE    Elicio Gorga  FAO:[id number] DOB: Sep 08, 1966 DOA: 08/08/2023 PCP: Patient, No Pcp Per  141A/141A-AA  LOS: 5 days   Brief hospital course:   Assessment & Plan: 57 year old M with PMH of DM-2, HTN, obesity and OSA presenting with increased bilateral leg swelling over 2 months more so for 2 weeks, intermittent abdominal pain, decreased p.o. intake, nausea, vomiting and weight loss, and admitted for uncontrolled hypertension, hypokalemia, elevated liver enzymes, elevated troponin and bilateral lower extremity edema.   Patient is taking mifepristone (steroids for high blood sugars) no history of Cushing syndrome though.  His blood pressure was not responding to p.o. and as needed IV meds.  He was transferred to stepdown and started on Cardene drip.  Workup concerning for hyperaldosteronism.  He also had elevated LFT.  Imaging showed cholelithiasis but HIDA scan negative.  He was empirically started on IV Zosyn for possible infection.    Patient's blood pressure and hypokalemia improved with up titration of Aldactone.     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  Serum aldosterone level pending.  Off Cardene drip as of 8/23 Plan: -Continue lisinopril 40 mg daily --cont aldactone 100 mg daily --cont coreg 12.5 mg BID (changed from metop) -Outpatient follow-up with endocrinology Dr. 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  Previous provider discussed with [facility] endocrine (Dr. Lum Babe). She suggested further outpatient work up, once discharged her office will call him to schedule appointment.  --per [facility], if left adrenal mass "were to be confirmed to be a functioning mass causing his hypertension and hypokalemia, he will need referral to a tertiary center for further  surgical management." -Continue increased aldosterone as above -Follow aldosterone level   COVID infection --fever on 8/22.  Noted some some productive cough.   --COVID returned pos today --currently not hypoxic.   --no need for covid tx  Cholelithiasis without obstruction/elevated LFT -HIDA scan negative.  General surgery recommended no intervention.   --GI consulted, "Suspect LFT rise is due to poor po intake/hypoperfusion or congestive hepatopathy."    # n/v, post prandial abdominal pain - GI consulted, due to hypertensive urgency and hypokalemia, egd could not be performed (8/22) as previously planned. --esophagram/upper gi study on 8/23 wnl  SIRS:  With fever, tachycardia, tachypnea.  Started on empiric abx on 8/22. --COVID pos today --d/c abx today  Elevated troponin: Likely demand ischemia from uncontrolled hypertension.  TTE without significant finding. -Manage hypertension as above   Severe Hypokalemia Likely due to hyperaldosteronism.  -Monitor replenish as appropriate -cont spironolactone.   Hypomagnesemia --monitor and supplement PRN   History of DM-2 with hyperglycemia: A1c 5.8, well controlled. --no need for BG checks.   Bilateral leg edema, edema improved: Likely from uncontrolled hypertension.  Appears euvolemic on exam.  TTE without significant finding. -Manage hypertension as above   Prolonged QT -Minimize QT prolonging drugs -Optimize electrolytes   Morbid obesity Body mass index is 38.38 kg/m.   DVT prophylaxis: Lovenox SQ Code Status: Full code  Family Communication: wife updated on speaker phone today Level of care: Telemetry Medical Dispo:   The patient is from: home Anticipated d/c is to: home Anticipated d/c date is: 1-2 days Patient currently is not medically ready to d/c due to: potassium level still very low despite aggressive supplementation.   Subjective and Interval  History:  No new complaint.    Due to fevers a few days  ago, COVID test ordered today, which came back positive.   Objective: Vitals:   08/13/23 1601 08/13/23 2335 08/14/23 0812 08/14/23 1523  BP: (!) 169/81 (!) 149/82 (!) 160/79 (!) 156/79  Pulse: 75 67 62 72  Resp: 18 18 18 18   Temp: 98.4 F (36.9 C) 97.8 F (36.6 C) 98 F (36.7 C) 98.2 F (36.8 C)  TempSrc:   Oral   SpO2: 97% 97% 97% 99%  Weight:      Height:        Intake/Output Summary (Last 24 hours) at 08/14/2023 1909 Last data filed at 08/14/2023 1720 Gross per 24 hour  Intake --  Output 450 ml  Net -450 ml   Filed Weights   08/10/23 0416 08/11/23 0500 08/11/23 1745  Weight: 113 kg 115.3 kg 114.5 kg    Examination:   Constitutional: NAD, AAOx3 HEENT: conjunctivae and lids normal, EOMI CV: No cyanosis.   RESP: normal respiratory effort, on RA Neuro: II - XII grossly intact.   Psych: Normal mood and affect.  Appropriate judgement and reason   Data Reviewed: I have personally reviewed labs and imaging studies  Time spent: 50 minutes  Darlin Priestly, MD [facility] If 7PM-7AM, please contact night-coverage 08/14/2023, 7:09 PM

## 2023-08-14 NOTE — Progress Notes (Signed)
Mobility Specialist - Progress Note     08/14/23 1200  Mobility  Activity Ambulated independently in hallway  Level of Assistance Independent  Assistive Device None  Distance Ambulated (ft) 600 ft  Range of Motion/Exercises Active  Activity Response Tolerated well  Mobility Referral Yes  $Mobility charge 1 Mobility  Mobility Specialist Start Time (ACUTE ONLY) 1030  Mobility Specialist Stop Time (ACUTE ONLY) 1100  Mobility Specialist Time Calculation (min) (ACUTE ONLY) 30 min   Pt resting EOB upon entry on RA. Pt STS and ambulates to hallway down to medical mall with no AD indep. Pt returned to EOB and left with needs in reach.   Johnathan Hausen Mobility Specialist 08/14/23, 1:05 PM

## 2023-08-14 NOTE — Progress Notes (Signed)
PHARMACY CONSULT NOTE - ELECTROLYTES  Pharmacy Consult for Electrolyte Monitoring and Replacement   Recent Labs: Height: 5\' 8"  (172.7 cm) Weight: 114.5 kg (252 lb 6.8 oz) IBW/kg (Calculated) : 68.4 Estimated Creatinine Clearance: 122.1 mL/min (by C-G formula based on SCr of 0.72 mg/dL). Potassium (mmol/L)  Date Value  08/14/2023 2.9 (L)   Magnesium (mg/dL)  Date Value  16/09/9603 1.9   Calcium (mg/dL)  Date Value  54/08/8118 7.9 (L)   Albumin (g/dL)  Date Value  14/78/2956 3.3 (L)   Phosphorus (mg/dL)  Date Value  21/30/8657 2.3 (L)   Sodium (mmol/L)  Date Value  08/14/2023 136   Assessment  Logan Harrell is a 57 y.o. male presenting with leg swelling, hypokalemia. PMH significant for DM2, HTN, HLD . Pharmacy has been consulted to monitor and replace electrolytes.  Diet: heart MIVF: N/A Pertinent medications: Aldactone 100mg  daily, lisinopril 40 mg daily  Goal of Therapy: Electrolytes WNL  Plan:  K 2.9    MD ordered Kcl PO q4h x 3 doses Phos 2.3  Will order Kphos neutral 500mg  po x 1  F/u with AM labs.   Thank you for allowing pharmacy to be a part of this patient's care.  Angelique Blonder, PharmD Clinical Pharmacist 08/14/2023 8:35 AM

## 2023-08-15 ENCOUNTER — Encounter: Payer: Self-pay | Admitting: Family Medicine

## 2023-08-15 DIAGNOSIS — R600 Localized edema: Secondary | ICD-10-CM | POA: Diagnosis not present

## 2023-08-15 LAB — HEPATIC FUNCTION PANEL
ALT: 106 U/L — ABNORMAL HIGH (ref 0–44)
AST: 129 U/L — ABNORMAL HIGH (ref 15–41)
Albumin: 2.9 g/dL — ABNORMAL LOW (ref 3.5–5.0)
Alkaline Phosphatase: 38 U/L (ref 38–126)
Bilirubin, Direct: 0.2 mg/dL (ref 0.0–0.2)
Indirect Bilirubin: 0.6 mg/dL (ref 0.3–0.9)
Total Bilirubin: 0.8 mg/dL (ref 0.3–1.2)
Total Protein: 5.8 g/dL — ABNORMAL LOW (ref 6.5–8.1)

## 2023-08-15 LAB — BASIC METABOLIC PANEL
Anion gap: 8 (ref 5–15)
BUN: 10 mg/dL (ref 6–20)
CO2: 24 mmol/L (ref 22–32)
Calcium: 7.8 mg/dL — ABNORMAL LOW (ref 8.9–10.3)
Chloride: 102 mmol/L (ref 98–111)
Creatinine, Ser: 0.69 mg/dL (ref 0.61–1.24)
GFR, Estimated: >60 mL/min (ref >60)
Glucose, Bld: 119 mg/dL — ABNORMAL HIGH (ref 70–99)
Potassium: 3.3 mmol/L — ABNORMAL LOW (ref 3.5–5.1)
Sodium: 134 mmol/L — ABNORMAL LOW (ref 135–145)

## 2023-08-15 LAB — PHOSPHORUS: Phosphorus: 3.2 mg/dL (ref 2.5–4.6)

## 2023-08-15 LAB — MAGNESIUM: Magnesium: 1.8 mg/dL (ref 1.7–2.4)

## 2023-08-15 MED ORDER — CARVEDILOL 12.5 MG PO TABS: 12.5000 mg | ORAL_TABLET | Freq: Two times a day (BID) | ORAL | 11 refills | Status: AC

## 2023-08-15 MED ORDER — SPIRONOLACTONE 100 MG PO TABS: 100.0000 mg | ORAL_TABLET | Freq: Every day | ORAL | 11 refills | Status: AC

## 2023-08-15 MED ORDER — AMLODIPINE BESYLATE 5 MG PO TABS: 5.0000 mg | ORAL_TABLET | Freq: Every evening | ORAL | 2 refills | Status: AC

## 2023-08-15 MED ORDER — POTASSIUM CHLORIDE CRYS ER 20 MEQ PO TBCR
40.0000 meq | EXTENDED_RELEASE_TABLET | Freq: Once | ORAL | Status: AC
  Administered 2023-08-15: 40 meq via ORAL
  Filled 2023-08-15: qty 2

## 2023-08-15 MED ORDER — LISINOPRIL 40 MG PO TABS: 40.0000 mg | ORAL_TABLET | Freq: Every day | ORAL | 11 refills | Status: AC

## 2023-08-15 MED ORDER — AMLODIPINE BESYLATE 5 MG PO TABS: 5.0000 mg | ORAL_TABLET | Freq: Every evening | ORAL | Status: DC

## 2023-08-15 NOTE — Progress Notes (Signed)
Mobility Specialist - Progress Note     08/15/23 1044  Mobility  Activity Ambulated independently in hallway  Level of Assistance Independent  Assistive Device None  Distance Ambulated (ft) 1000 ft  Range of Motion/Exercises Active  Activity Response Tolerated well  Mobility Referral Yes  $Mobility charge 1 Mobility  Mobility Specialist Start Time (ACUTE ONLY) 1025  Mobility Specialist Stop Time (ACUTE ONLY) 1042  Mobility Specialist Time Calculation (min) (ACUTE ONLY) 17 min    [employer] Mobility Specialist 08/15/23, 10:50 AM

## 2023-08-15 NOTE — Plan of Care (Signed)
  Problem: Education: Goal: Ability to describe self-care measures that may prevent or decrease complications (Diabetes Survival Skills Education) will improve Outcome: Progressing Goal: Individualized Educational Video(s) Outcome: Progressing   Problem: Coping: Goal: Ability to adjust to condition or change in health will improve Outcome: Progressing   Problem: Fluid Volume: Goal: Ability to maintain a balanced intake and output will improve Outcome: Progressing   Problem: Health Behavior/Discharge Planning: Goal: Ability to identify and utilize available resources and services will improve Outcome: Progressing Goal: Ability to manage health-related needs will improve Outcome: Progressing   Problem: Metabolic: Goal: Ability to maintain appropriate glucose levels will improve Outcome: Progressing   Problem: Nutritional: Goal: Maintenance of adequate nutrition will improve Outcome: Progressing Goal: Progress toward achieving an optimal weight will improve Outcome: Progressing   Problem: Skin Integrity: Goal: Risk for impaired skin integrity will decrease Outcome: Progressing   Problem: Tissue Perfusion: Goal: Adequacy of tissue perfusion will improve Outcome: Progressing   Problem: Education: Goal: Knowledge of General Education information will improve Description: Including pain rating scale, medication(s)/side effects and non-pharmacologic comfort measures Outcome: Progressing   Problem: Health Behavior/Discharge Planning: Goal: Ability to manage health-related needs will improve Outcome: Progressing   Problem: Clinical Measurements: Goal: Ability to maintain clinical measurements within normal limits will improve Outcome: Progressing Goal: Will remain free from infection Outcome: Progressing Goal: Diagnostic test results will improve Outcome: Progressing Goal: Respiratory complications will improve Outcome: Progressing Goal: Cardiovascular complication will  be avoided Outcome: Progressing   Problem: Activity: Goal: Risk for activity intolerance will decrease Outcome: Progressing   Problem: Nutrition: Goal: Adequate nutrition will be maintained Outcome: Progressing   Problem: Coping: Goal: Level of anxiety will decrease Outcome: Progressing   Problem: Elimination: Goal: Will not experience complications related to bowel motility Outcome: Progressing Goal: Will not experience complications related to urinary retention Outcome: Progressing   Problem: Pain Managment: Goal: General experience of comfort will improve Outcome: Progressing   Problem: Safety: Goal: Ability to remain free from injury will improve Outcome: Progressing   Problem: Skin Integrity: Goal: Risk for impaired skin integrity will decrease Outcome: Progressing   Problem: Fluid Volume: Goal: Hemodynamic stability will improve Outcome: Progressing   Problem: Clinical Measurements: Goal: Diagnostic test results will improve Outcome: Progressing Goal: Signs and symptoms of infection will decrease Outcome: Progressing   Problem: Respiratory: Goal: Ability to maintain adequate ventilation will improve Outcome: Progressing   

## 2023-08-15 NOTE — Progress Notes (Signed)
PHARMACY CONSULT NOTE - ELECTROLYTES  Pharmacy Consult for Electrolyte Monitoring and Replacement   Recent Labs: Height: 5\' 8"  (172.7 cm) Weight: 114.5 kg (252 lb 6.8 oz) IBW/kg (Calculated) : 68.4 Estimated Creatinine Clearance: 122.1 mL/min (by C-G formula based on SCr of 0.69 mg/dL). Potassium (mmol/L)  Date Value  08/15/2023 3.3 (L)   Magnesium (mg/dL)  Date Value  59/56/3875 1.8   Calcium (mg/dL)  Date Value  64/33/2951 7.8 (L)   Albumin (g/dL)  Date Value  88/41/6606 3.3 (L)   Phosphorus (mg/dL)  Date Value  30/16/0109 3.2   Sodium (mmol/L)  Date Value  08/15/2023 134 (L)   Assessment  Logan Harrell is a 57 y.o. male presenting with leg swelling, hypokalemia. PMH significant for DM2, HTN, HLD . Pharmacy has been consulted to monitor and replace electrolytes.  Diet: heart MIVF: N/A Pertinent medications: Aldactone 100mg  daily, lisinopril 40 mg daily  Goal of Therapy: Electrolytes WNL  Plan: K 3.3 >> potassium chloride 40 mEq PO x 1 ordered by MD No other electrolyte replacement warranted currently Will check BMP and Phos with next AM labs  Thank you for allowing pharmacy to be a part of this patient's care.  Orson Aloe, PharmD Clinical Pharmacist 08/15/2023 10:44 AM

## 2023-08-15 NOTE — Progress Notes (Signed)
Nutrition Follow-up  DOCUMENTATION CODES:   Morbid obesity  INTERVENTION:   -Continue MVI with minerals daily -Magic cup TID with meals, each supplement provides 290 kcal and 9 grams of protein  -Liberalize diet to carb modified for wider variety of meal selections   NUTRITION DIAGNOSIS:   Inadequate oral intake related to altered GI function as evidenced by NPO status.  Progressing  GOAL:   Patient will meet greater than or equal to 90% of their needs  Progressing   MONITOR:   PO intake, Supplement acceptance, Diet advancement  REASON FOR ASSESSMENT:   Consult Assessment of nutrition requirement/status  ASSESSMENT:   Pt with medical history significant for diabetes mellitus type 2, hypertension, obesity, sleep apnea presenting with leg swelling that is been going on for for the past 2 months PTA  Reviewed I/O's: -750 ml x 24 hours and +4.6 L since admission  UOP: 750 ml x 24 hours   Pt unavailable at time of visit. Attempted to speak with pt via call to hospital room phone, however, unable to reach.   Pt currently on a heart healthy diet. Noted meal completions 45-100%.   GI recommending EGD, colonoscopy, and GES as outpatient. 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   Medications reviewed and include ativan, melatonin, and aldactone.   Labs reviewed: Na: 134, K: 3.3, CBGS: 177-244 (inpatient orders for glycemic control are none).    Diet Order:   Diet Order             Diet Heart Room service appropriate? Yes; Fluid consistency: Thin  Diet effective now                   EDUCATION NEEDS:   Education needs have been addressed  Skin:  Skin Assessment: Reviewed RN Assessment  Last BM:  08/08/23  Height:   Ht Readings from Last 1 Encounters:  08/11/23 5\' 8"  (1.727 m)    Weight:   Wt Readings from Last 1 Encounters:  08/11/23 114.5 kg    Ideal Body Weight:  67.3 kg  BMI:  Body mass index is 38.38 kg/m.  Estimated  Nutritional Needs:   Kcal:  1750-1950  Protein:  100-115 grams  Fluid:  > 1.7 L    Levada Schilling, RD, LDN, CDCES Registered Dietitian II Certified Diabetes Care and Education Specialist Please refer to [facility] for RD and/or RD on-call/weekend/after hours pager

## 2023-08-15 NOTE — Plan of Care (Signed)
Problem: Education: Goal: Ability to describe self-care measures that may prevent or decrease complications (Diabetes Survival Skills Education) will improve 08/15/2023 1646 by Latanya Maudlin, RN Outcome: Completed/Met 08/15/2023 1141 by Latanya Maudlin, RN Outcome: Progressing Goal: Individualized Educational Video(s) 08/15/2023 1646 by Latanya Maudlin, RN Outcome: Completed/Met 08/15/2023 1141 by Latanya Maudlin, RN Outcome: Progressing   Problem: Coping: Goal: Ability to adjust to condition or change in health will improve 08/15/2023 1646 by Latanya Maudlin, RN Outcome: Completed/Met 08/15/2023 1141 by Latanya Maudlin, RN Outcome: Progressing   Problem: Fluid Volume: Goal: Ability to maintain a balanced intake and output will improve 08/15/2023 1646 by Latanya Maudlin, RN Outcome: Completed/Met 08/15/2023 1141 by Latanya Maudlin, RN Outcome: Progressing   Problem: Health Behavior/Discharge Planning: Goal: Ability to identify and utilize available resources and services will improve 08/15/2023 1646 by Latanya Maudlin, RN Outcome: Completed/Met 08/15/2023 1141 by Latanya Maudlin, RN Outcome: Progressing Goal: Ability to manage health-related needs will improve 08/15/2023 1646 by Latanya Maudlin, RN Outcome: Completed/Met 08/15/2023 1141 by Latanya Maudlin, RN Outcome: Progressing   Problem: Metabolic: Goal: Ability to maintain appropriate glucose levels will improve 08/15/2023 1646 by Latanya Maudlin, RN Outcome: Completed/Met 08/15/2023 1141 by Latanya Maudlin, RN Outcome: Progressing   Problem: Nutritional: Goal: Maintenance of adequate nutrition will improve 08/15/2023 1646 by Latanya Maudlin, RN Outcome: Completed/Met 08/15/2023 1141 by Latanya Maudlin, RN Outcome: Progressing Goal: Progress toward achieving an optimal weight will improve 08/15/2023 1646 by Latanya Maudlin, RN Outcome: Completed/Met 08/15/2023 1141 by Latanya Maudlin, RN Outcome:  Progressing   Problem: Skin Integrity: Goal: Risk for impaired skin integrity will decrease 08/15/2023 1646 by Latanya Maudlin, RN Outcome: Completed/Met 08/15/2023 1141 by Latanya Maudlin, RN Outcome: Progressing   Problem: Tissue Perfusion: Goal: Adequacy of tissue perfusion will improve 08/15/2023 1646 by Latanya Maudlin, RN Outcome: Completed/Met 08/15/2023 1141 by Latanya Maudlin, RN Outcome: Progressing   Problem: Education: Goal: Knowledge of General Education information will improve Description: Including pain rating scale, medication(s)/side effects and non-pharmacologic comfort measures 08/15/2023 1646 by Latanya Maudlin, RN Outcome: Completed/Met 08/15/2023 1141 by Latanya Maudlin, RN Outcome: Progressing   Problem: Health Behavior/Discharge Planning: Goal: Ability to manage health-related needs will improve 08/15/2023 1646 by Latanya Maudlin, RN Outcome: Completed/Met 08/15/2023 1141 by Latanya Maudlin, RN Outcome: Progressing   Problem: Clinical Measurements: Goal: Ability to maintain clinical measurements within normal limits will improve 08/15/2023 1646 by Latanya Maudlin, RN Outcome: Completed/Met 08/15/2023 1141 by Latanya Maudlin, RN Outcome: Progressing Goal: Will remain free from infection 08/15/2023 1646 by Latanya Maudlin, RN Outcome: Completed/Met 08/15/2023 1141 by Latanya Maudlin, RN Outcome: Progressing Goal: Diagnostic test results will improve 08/15/2023 1646 by Latanya Maudlin, RN Outcome: Completed/Met 08/15/2023 1141 by Latanya Maudlin, RN Outcome: Progressing Goal: Respiratory complications will improve 08/15/2023 1646 by Latanya Maudlin, RN Outcome: Completed/Met 08/15/2023 1141 by Latanya Maudlin, RN Outcome: Progressing Goal: Cardiovascular complication will be avoided 08/15/2023 1646 by Latanya Maudlin, RN Outcome: Completed/Met 08/15/2023 1141 by Latanya Maudlin, RN Outcome: Progressing   Problem: Activity: Goal: Risk  for activity intolerance will decrease 08/15/2023 1646 by Latanya Maudlin, RN Outcome: Completed/Met 08/15/2023 1141 by Latanya Maudlin, RN Outcome: Progressing   Problem: Nutrition: Goal: Adequate nutrition will be maintained 08/15/2023 1646 by Latanya Maudlin, RN Outcome: Completed/Met 08/15/2023 1141 by Latanya Maudlin, RN Outcome: Progressing  Problem: Coping: Goal: Level of anxiety will decrease 08/15/2023 1646 by Latanya Maudlin, RN Outcome: Completed/Met 08/15/2023 1141 by Latanya Maudlin, RN Outcome: Progressing   Problem: Elimination: Goal: Will not experience complications related to bowel motility 08/15/2023 1646 by Latanya Maudlin, RN Outcome: Completed/Met 08/15/2023 1141 by Latanya Maudlin, RN Outcome: Progressing Goal: Will not experience complications related to urinary retention 08/15/2023 1646 by Latanya Maudlin, RN Outcome: Completed/Met 08/15/2023 1141 by Latanya Maudlin, RN Outcome: Progressing   Problem: Pain Managment: Goal: General experience of comfort will improve 08/15/2023 1646 by Latanya Maudlin, RN Outcome: Completed/Met 08/15/2023 1141 by Latanya Maudlin, RN Outcome: Progressing   Problem: Safety: Goal: Ability to remain free from injury will improve 08/15/2023 1646 by Latanya Maudlin, RN Outcome: Completed/Met 08/15/2023 1141 by Latanya Maudlin, RN Outcome: Progressing   Problem: Skin Integrity: Goal: Risk for impaired skin integrity will decrease 08/15/2023 1646 by Latanya Maudlin, RN Outcome: Completed/Met 08/15/2023 1141 by Latanya Maudlin, RN Outcome: Progressing   Problem: Fluid Volume: Goal: Hemodynamic stability will improve 08/15/2023 1646 by Latanya Maudlin, RN Outcome: Completed/Met 08/15/2023 1141 by Latanya Maudlin, RN Outcome: Progressing   Problem: Clinical Measurements: Goal: Diagnostic test results will improve 08/15/2023 1646 by Latanya Maudlin, RN Outcome: Completed/Met 08/15/2023 1141 by Latanya Maudlin, RN Outcome: Progressing Goal: Signs and symptoms of infection will decrease 08/15/2023 1646 by Latanya Maudlin, RN Outcome: Completed/Met 08/15/2023 1141 by Latanya Maudlin, RN Outcome: Progressing   Problem: Respiratory: Goal: Ability to maintain adequate ventilation will improve 08/15/2023 1646 by Latanya Maudlin, RN Outcome: Completed/Met 08/15/2023 1141 by Latanya Maudlin, RN Outcome: Progressing

## 2023-08-16 LAB — CULTURE, BLOOD (ROUTINE X 2)
Culture: NO GROWTH
Culture: NO GROWTH
Special Requests: ADEQUATE
Special Requests: ADEQUATE

## 2023-08-16 NOTE — Discharge Summary (Signed)
[facility] Discharge Summary   Patient: Logan Harrell OAC:[id number]  PCP: Patient, No Pcp Per  Date of admission: 08/08/2023   Date of discharge: 08/15/2023 08/16/2023     Discharge Diagnoses:  Principal Problem:   Bilateral leg edema Active Problems:   History of type 2 diabetes mellitus   History of hypertension   Elevated LFTs   Cholelithiasis without obstruction   Hypokalemia   Elevated troponin   Malignant hypertension   Primary aldosteronism (HCC)   Obesity (BMI 30-39.9)   Admitted From: Home Disposition:  Home   Recommendations for Outpatient Follow-up:  Follow-up with PCP next week, repeat LFTs, BMP and CBC.  Monitor BP at home and follow with PCP to titrate medications accordingly.  Monitor CBG and continue diabetic diet. Patient had hypokalemia and resistant hypertension during hospital stay it could be due to mifepristone Shelly Coss) the patient was advised to hold off this medication until follow-up with PCP or endocrinologist for further management of resistant hyperglycemia. Started amlodipine and Coreg, increase to lisinopril from 20 to 40 mg p.o. daily and increased Aldactone 100 mg p.o. daily.  Monitor BP at home and follow with PCP to titrate medications accordingly. Pulmonary nodules, follow with PCP to repeat CT scan and follow-up for lung cancer screening Left adrenal nodule: Patient was advised to follow-up with PCP for repeat CT scan and further workup of left retinal nodule, may need to follow with surgery at [facility] or any tertiary care center if suspected adrenal gland tumor. Follow up LABS/TEST:     Follow-up Information     Talmage Coin, MD. Go on 08/23/2023.   Specialty: Endocrinology Why: This office is not accepting new patients Contact information: [address] [place] [address] [phone]         Ocie Cornfield, MD Follow up.   Specialty: Internal Medicine Why: Per office; Dr. Nash Dimmer not accepting new patients;'  This  doctor is accepting new patients and will call back w/ Appt Contact information: [address] [place] [address] [phone]                Diet recommendation: Cardiac and Carb modified diet  Activity: The patient is advised to gradually reintroduce usual activities, as tolerated  Discharge Condition: stable  Code Status: Full code   History of present illness: As per the H and P dictated on admission Hospital Course:  57 year old M with PMH of DM-2, HTN, obesity and OSA presenting with increased bilateral leg swelling over 2 months more so for 2 weeks, intermittent abdominal pain, decreased p.o. intake, nausea, vomiting and weight loss, and admitted for uncontrolled hypertension, hypokalemia, elevated liver enzymes, elevated troponin and bilateral lower extremity edema.  Patient is taking mifepristone (steroids for high blood sugars) no history of Cushing syndrome though.  His blood pressure was not responding to p.o. and as needed IV meds.  He was transferred to stepdown and started on Cardene drip.  Workup concerning for hyperaldosteronism.  He also had elevated LFT.  Imaging showed cholelithiasis but HIDA scan negative.  He was empirically started on IV Zosyn for possible infection.    Patient's blood pressure and hypokalemia improved with up titration of Aldactone.    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  Serum aldosterone/renin level is still pending.  Off Cardene drip as of 8/23 Increased lisinopril 40 mg daily, increased Aldactone 100 mg p.o. daily, started Coreg  12.5 mg p.o. twice daily, amlodipine 5 mg p.o. in the evening.  Patient was advised to monitor BP at home and follow with PCP to titrate medications accordingly. 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  Previous provider discussed with [facility] endocrine (Dr. Lum Babe). She suggested further outpatient work up, once discharged her office will call him to schedule appointment. per GenSurg, if left adrenal mass "were to be confirmed to be a functioning mass causing his hypertension and hypokalemia, he will need referral to a tertiary center for further surgical management."  Blood pressures well-controlled now with above management, hypokalemia resolved.  Patient was advised to follow-up with endocrinologist as an outpatient.  Follow-up pending aldosterone/renin level. # COVID infection: fever on 8/22.  Noted some some productive cough.   COVID returned positive on 8/25.  Saturating well on room air no hypoxia.  No need for COVID treatment.  Continue isolation as per CDC recommendation. # Cholelithiasis without obstruction/elevated LFT: HIDA scan negative.  General surgery recommended no intervention.  GI consulted, "Suspect LFT rise is due to poor po intake/hypoperfusion or congestive hepatopathy."   Elevated LFTs could be secondary to COVID viral infection.  Follow-up with PCP in 1 week to repeat LFTs.   # n/v, post prandial abdominal pain GI consulted, due to hypertensive urgency and hypokalemia, egd could not be performed (8/22) as previously planned. esophagram/upper gi study on 8/23 wnl.  Follow-up with GI for EGD as an outpatient. # SIRS due to COVID viral infection.  Patient had fever, tachycardia, tachypnea.  Started on empiric abx on 8/22. COVID pos on 8/25, so antibiotics were discontinued. # Elevated troponin: Likely demand ischemia from uncontrolled hypertension.  TTE without significant finding. Manage hypertension as above # Severe Hypokalemia: Likely due to hyperaldosteronism.  Continue spironolactone, potassium repleted.  Hypokalemia resolved. # Hypomagnesemia.  Mag repleted and resolved. # Hypophosphatemia, Phos repleted and resolved. # History of DM-2  with hyperglycemia: A1c 5.8, well controlled.  Resumed home regimen on discharge.  Patient was on mifepristone due to uncontrolled diabetes, which was discontinued on discharge, possible causing hypokalemia and uncontrolled hypertension.  Recommended to follow-up with endocrinologist for further management of diabetes and recommendation of using mifepristone if needed with close monitoring.  Patient agreed with the plan. # Bilateral leg edema, edema improved: Likely from uncontrolled hypertension.  Appears euvolemic on exam.  TTE without significant finding. Manage hypertension as above # Prolonged QT: Minimize QT prolonging drugs and Optimize electrolytes   Body mass index is 38.38 kg/m.  Nutrition Problem: Inadequate oral intake Etiology: altered GI function Nutrition Interventions: Interventions: Refer to RD note for recommendations  Patient was ambulatory without any assistance. On the day of the discharge the patient's vitals were stable, and no other acute medical condition were reported by patient. the patient was felt safe to be discharge at Home.  Consultants: None Procedures: None  Discharge Exam: General: Appear in no distress, no Rash; Oral Mucosa Clear, moist. Cardiovascular: S1 and S2 Present, no Murmur, Respiratory: normal respiratory effort, Bilateral Air entry present and no Crackles, no wheezes Abdomen: Bowel Sound present, Soft and no tenderness, no hernia Extremities: no Pedal edema, no calf tenderness Neurology: alert and oriented to time, place, and person affect appropriate.  Filed Weights   08/10/23 0416 08/11/23 0500 08/11/23 1745  Weight: 113 kg 115.3 kg 114.5 kg   Vitals:   08/15/23  1610 08/15/23 1600  BP: (!) 165/95 (!) 161/103  Pulse: 66 66  Resp:  16  Temp: (!) 97.5 F (36.4 C) 98.9 F (37.2 C)  SpO2: 100% 100%    DISCHARGE MEDICATION: Allergies as of 08/15/2023   No Known Allergies      Medication List     STOP taking these medications     Kombiglyze XR 2.04-999 MG Tb24 Generic drug: Saxagliptin-Metformin   Korlym 300 MG Tabs Generic drug: miFEPRIStone   sildenafil 50 MG tablet Commonly known as: VIAGRA   Testosterone Enanthate 75 MG/0.5ML Soaj Commonly known as: Xyosted   Toujeo SoloStar 300 UNIT/ML Solostar Pen Generic drug: insulin glargine (1 Unit Dial)       TAKE these medications    amLODipine 5 MG tablet Commonly known as: NORVASC Take 1 tablet (5 mg total) by mouth every evening.   ascorbic acid 500 MG tablet Commonly known as: VITAMIN C Take 500 mg by mouth daily. Notes to patient: Not given in hospital   aspirin EC 81 MG tablet Take 81 mg by mouth daily.   carvedilol 12.5 MG tablet Commonly known as: COREG Take 1 tablet (12.5 mg total) by mouth 2 (two) times daily with a meal.   Janumet XR 50-1000 MG Tb24 Generic drug: SitaGLIPtin-MetFORMIN HCl Take 1 tablet by mouth daily. Notes to patient: Not given in hospital   Jardiance 25 MG Tabs tablet Generic drug: empagliflozin Take 25 mg by mouth daily. Notes to patient: Not given in hospital   lisinopril 40 MG tablet Commonly known as: ZESTRIL Take 1 tablet (40 mg total) by mouth daily. What changed:  medication strength how much to take   multivitamin tablet Take 1 tablet by mouth daily.   Ozempic (0.25 or 0.5 MG/DOSE) 2 MG/1.5ML Sopn Generic drug: Semaglutide(0.25 or 0.5MG /DOS) Inject 1 mg into the skin once a week. Notes to patient: Not given in hospital   rosuvastatin 10 MG tablet Commonly known as: CRESTOR Take 10 mg by mouth daily. Notes to patient: Not given in hospital   spironolactone 100 MG tablet Commonly known as: ALDACTONE Take 1 tablet (100 mg total) by mouth daily. What changed:  medication strength how much to take   VITAMIN D (CHOLECALCIFEROL) PO Take 1-2 capsules by mouth daily. Notes to patient: Not given in hospital       No Known Allergies Discharge Instructions     Call MD for:  difficulty  breathing, headache or visual disturbances   Complete by: As directed    Call MD for:  extreme fatigue   Complete by: As directed    Call MD for:  persistant dizziness or light-headedness   Complete by: As directed    Call MD for:  persistant nausea and vomiting   Complete by: As directed    Call MD for:  severe uncontrolled pain   Complete by: As directed    Call MD for:  temperature >100.4   Complete by: As directed    Diet - low sodium heart healthy   Complete by: As directed    Diet Carb Modified   Complete by: As directed    Discharge instructions   Complete by: As directed    Follow-up with PCP next week, repeat LFTs, BMP and CBC.  Monitor BP at home and follow with PCP to titrate medications accordingly.  Monitor CBG and continue diabetic diet. Patient had hypokalemia and resistant hypertension during hospital stay it could be due to mifepristone Shelly Coss) the patient was advised  to hold off this medication until follow-up with PCP or endocrinologist for further management of resistant hyperglycemia. Started amlodipine and Coreg, increase to lisinopril from 20 to 40 mg p.o. daily and increased Aldactone 100 mg p.o. daily.  Monitor BP at home and follow with PCP to titrate medications accordingly. Pulmonary nodules, follow with PCP to repeat CT scan and follow-up for lung cancer screening Left adrenal nodule: Patient was advised to follow-up with PCP for repeat CT scan and further workup of left retinal nodule, may need to follow with surgery at [facility] or any tertiary care center if suspected adrenal gland tumor.   Increase activity slowly   Complete by: As directed        The results of significant diagnostics from this hospitalization (including imaging, microbiology, ancillary and laboratory) are listed below for reference.    Significant Diagnostic Studies: DG UGI W SINGLE CM (SOL OR THIN BA)  Result Date: 08/12/2023 CLINICAL DATA:  [id number] Nausea AND vomiting [id number]. EXAM:  WATER SOLUBLE UPPER GI SERIES TECHNIQUE: Single-column upper GI series was performed using water soluble contrast. CONTRAST:  Omnipaque 300. COMPARISON:  Chest radiograph 08/11/2023. CT chest/abdomen/pelvis 08/10/2023. FLUOROSCOPY: Fluoroscopy Time:  30 seconds. Radiation Exposure Index (if provided by the fluoroscopic device): 16.5 mGy reference air kerma. Number of Acquired Spot Images: 6 FINDINGS: Esophagus: Normal appearance.  No obstruction. Esophageal motility: Within normal limits. Gastroesophageal reflux: None visualized. Stomach: Normal appearance. No hiatal hernia. Gastric emptying: Normal. Duodenum: Normal appearance. Other:  None. IMPRESSION: Normal single contrast upper GI series. Electronically Signed   By: Orvan Falconer M.D.   On: 08/12/2023 09:18   DG Chest Port 1 View  Result Date: 08/11/2023 CLINICAL DATA:  Hypoxia EXAM: PORTABLE CHEST 1 VIEW COMPARISON:  08/08/2023 FINDINGS: Cardiomegaly, vascular congestion. No confluent opacities, effusions or edema. No acute bony abnormality. IMPRESSION: Cardiomegaly, vascular congestion. Electronically Signed   By: Charlett Nose M.D.   On: 08/11/2023 22:18   NM Hepatobiliary Liver Func  Result Date: 08/11/2023 CLINICAL DATA:  Cholelithiasis EXAM: NUCLEAR MEDICINE HEPATOBILIARY IMAGING TECHNIQUE: Sequential images of the abdomen were obtained out to 60 minutes following intravenous administration of radiopharmaceutical. RADIOPHARMACEUTICALS:  7.67 mCi Tc-53m  Choletec IV COMPARISON:  CT 08/10/2023.  Ultrasound abdomen 08/08/2023 FINDINGS: Prompt uptake and biliary excretion of activity by the liver is seen. Gallbladder activity is visualized by 60 minutes proximally, consistent with patency of cystic duct. Biliary activity passes into small bowel, consistent with patent common bile duct. IMPRESSION: No common duct or cystic duct obstruction. Electronically Signed   By: Karen Kays M.D.   On: 08/11/2023 17:42   CT CHEST ABDOMEN PELVIS W  CONTRAST  Result Date: 08/10/2023 CLINICAL DATA:  Abdominal pain, elevated liver enzymes, unintended weight loss with abdominal pain. Check for malignancy. EXAM: CT CHEST, ABDOMEN, AND PELVIS WITH CONTRAST TECHNIQUE: Multidetector CT imaging of the chest, abdomen and pelvis was performed following the standard protocol during bolus administration of intravenous contrast. RADIATION DOSE REDUCTION: This exam was performed according to the departmental dose-optimization program which includes automated exposure control, adjustment of the mA and/or kV according to patient size and/or use of iterative reconstruction technique. CONTRAST:  OMNIPAQUE IOHEXOL 300 MG/ML  SOLN COMPARISON:  Right upper quadrant ultrasound 08/08/2023 showing cholelithiasis without acute cholecystitis, and PA and lateral chest 08/08/2023. No prior CT for comparison. FINDINGS: CT CHEST FINDINGS Cardiovascular: There is mild cardiomegaly. No pericardial effusion. Minimal scattered three-vessel coronary artery calcifications. Pulmonary arteries are normal caliber and centrally clear. The aorta is  normal in caliber and course with mild atherosclerosis. The great vessels are clear with normal variant brachiobicarotid trunk. The pulmonary veins are nondistended. Mediastinum/Nodes: The visualized thyroid gland is unremarkable. Axillary spaces are clear. There are mildly enlarged mediastinal and hilar nodes, for example a precarinal lymph node measures 1.6 cm short axis on 2:23, AP window lymph node 1 cm short axis on 2:21, left paratracheal lymph node is 1.1 cm short axis on 2:16, and a subcarinal lymph node is 1.3 cm in short axis altered: 30. There are scattered left hilar lymph nodes up to 1.2 cm in short axis on 2:32, scattered right hilar nodes up to 1.1 cm in short axis on 2:28. There is no bulky or encasing adenopathy. Findings are nonspecific. The thoracic esophagus, thoracic trachea and main bronchi are unremarkable. Lungs/Pleura: No  pleural effusion, thickening or pneumothorax. Mild elevation right hemidiaphragm. There are mild centrilobular emphysematous changes in the upper lobes. There is mild posterior atelectasis in the upper and lower lobes. There are few linear scar-like opacities in both bases. Mild diffuse bronchial thickening is seen without bronchial plugging or bronchiectasis. There is a 6 mm subpleural ground-glass nodule in the right lower lobe medially on 4:76. There is a 3 mm subpleural left lower lobe nodule posteriorly on 4:96. There is a 6 mm subpleural right upper lobe nodule laterally on 4:45. No other nodules or active infiltrates are seen Musculoskeletal: There is spondylosis and bridging enthesopathy of the thoracic spine but no acute or other significant osseous findings, no aggressive lesion is seen. The ribcage is intact. There is bilateral moderate gynecomastia but no chest wall mass. CT ABDOMEN PELVIS FINDINGS Hepatobiliary: The liver is 21.5 cm in length and mildly steatotic, without mass enhancement. There is dilatation of the gallbladder up to 12 cm length but no wall thickening or bile duct dilatation. There are multiple tiny stones layering dependently in the gallbladder. Pancreas: No abnormality. Spleen: No abnormality. Adrenals/Urinary Tract: There is a left adrenal nodule measuring 1.9 x 1.7 cm, Hounsfield density is 82. Adrenal washout CT or chemical shift MRI recommended. The right adrenal and both kidneys are unremarkable apart from a 1.3 cm cyst posteriorly in the left kidney, Hounsfield density of 8.3. No follow-up imaging is recommended. There is no urinary stone or obstruction. There is mild generalized thickening of the bladder versus underdistention. Stomach/Bowel: No dilatation or wall thickening, including of the appendix. There is colonic diverticulosis without evidence of diverticulitis. Vascular/Lymphatic: Aortic atherosclerosis. No enlarged abdominal or pelvic lymph nodes. Reproductive: There  is no prostatomegaly. There is calcification in the right hemiprostate. There are mild stranding changes in the pelvic floor alongside the prostate, nonspecific but may be seen with prostatitis. Clinical correlation advised. Other: Small umbilical and inguinal fat hernias. No incarcerated hernia. No free fluid, free hemorrhage or free air. Musculoskeletal: There is advanced L4-5 facet hypertrophy, grade 1 L4-5 degenerative spondylolisthesis and severe acquired L4-5 spinal canal stenosis. Enthesopathic changes of the pelvis are also noted as well as ankylosis across the anterior SI joints. No acute or other significant osseous findings. IMPRESSION: 1. Mildly enlarged mediastinal and hilar lymph nodes, nonspecific. 2. Emphysema and bronchitis without evidence of pneumonia. 3. 6 mm ground-glass nodule in the right lower lobe, 3 mm subpleural nodule in the left lower lobe and 6 mm subpleural nodule in the right upper lobe. Initial follow-up with CT at 6 months is recommended to confirm persistence. If persistent, repeat CT is recommended every 2 years until 5 years of stability has  been established. This recommendation follows the consensus statement: Guidelines for Management of Incidental Pulmonary Nodules Detected on CT Images: From the Fleischner Society 2017; Radiology 2017; 284:228-243. 4. 1.9 x 1.7 cm left adrenal nodule. Adrenal washout CT or chemical shift MRI recommended. 5. Cholelithiasis with dilated gallbladder but no wall thickening or bile duct dilatation. 6. Cystitis versus bladder nondistention. 7. Mild stranding changes in the pelvic floor alongside the normal-sized prostate, nonspecific but may be seen with prostatitis. 8. Aortic and coronary artery atherosclerosis. Mild cardiomegaly. 9. Diverticulosis without evidence of diverticulitis. 10. Small umbilical and inguinal fat hernias. 11. Advanced L4-5 facet hypertrophy, grade 1 L4-5 degenerative spondylolisthesis and severe acquired L4-5 spinal canal  stenosis. Aortic Atherosclerosis (ICD10-I70.0) and Emphysema (ICD10-J43.9). Electronically Signed   By: Almira Bar M.D.   On: 08/10/2023 21:37   ECHOCARDIOGRAM COMPLETE  Result Date: 08/09/2023    ECHOCARDIOGRAM REPORT   Patient Name:   NIEL KAZ Date of Exam: 08/09/2023 Medical Rec #:  [id number]    Height:       67.0 in Accession #:    [id number]   Weight:       273.0 lb Date of Birth:  October 17, 1966    BSA:          2.308 m Patient Age:    57 years     BP:           165/83 mmHg Patient Gender: M            HR:           69 bpm. Exam Location:  [facility] Procedure: 2D Echo, Cardiac Doppler and Color Doppler Indications:     Shortness of breath                  Bilateral leg edema  History:         Patient has no prior history of Echocardiogram examinations.                  Risk Factors:Diabetes, Hypertension, Dyslipidemia and Sleep                  Apnea.  Sonographer:     Cristela Blue Referring Phys:  [id number] Eliezer Mccoy PATEL Diagnosing Phys: Yvonne Kendall MD IMPRESSIONS  1. Left ventricular ejection fraction, by estimation, is 55 to 60%. The left ventricle has normal function. The left ventricle has no regional wall motion abnormalities. Left ventricular diastolic parameters are consistent with Grade II diastolic dysfunction (pseudonormalization).  2. Right ventricular systolic function is normal. The right ventricular size is mildly enlarged. Tricuspid regurgitation signal is inadequate for assessing PA pressure.  3. The mitral valve is normal in structure. Trivial mitral valve regurgitation. No evidence of mitral stenosis.  4. The aortic valve is tricuspid. Aortic valve regurgitation is not visualized. No aortic stenosis is present. FINDINGS  Left Ventricle: Left ventricular ejection fraction, by estimation, is 55 to 60%. The left ventricle has normal function. The left ventricle has no regional wall motion abnormalities. The left ventricular internal cavity size was normal in size. There is  no left ventricular  hypertrophy. Left ventricular diastolic parameters are consistent with Grade II diastolic dysfunction (pseudonormalization). Right Ventricle: The right ventricular size is mildly enlarged. No increase in right ventricular wall thickness. Right ventricular systolic function is normal. Tricuspid regurgitation signal is inadequate for assessing PA pressure. Left Atrium: Left atrial size was normal in size. Right Atrium: Right atrial size was normal in size. Pericardium: Trivial pericardial effusion  is present. Mitral Valve: The mitral valve is normal in structure. Trivial mitral valve regurgitation. No evidence of mitral valve stenosis. MV peak gradient, 3.9 mmHg. The mean mitral valve gradient is 1.0 mmHg. Tricuspid Valve: The tricuspid valve is normal in structure. Tricuspid valve regurgitation is mild. Aortic Valve: The aortic valve is tricuspid. Aortic valve regurgitation is not visualized. No aortic stenosis is present. Aortic valve mean gradient measures 2.0 mmHg. Aortic valve peak gradient measures 4.4 mmHg. Aortic valve area, by VTI measures 3.45 cm. Pulmonic Valve: The pulmonic valve was normal in structure. Pulmonic valve regurgitation is trivial. No evidence of pulmonic stenosis. Aorta: The aortic root is normal in size and structure. Venous: The inferior vena cava was not well visualized. IAS/Shunts: The interatrial septum was not well visualized.  LEFT VENTRICLE PLAX 2D LVIDd:         4.80 cm   Diastology LVIDs:         3.30 cm   LV e' medial:    6.09 cm/s LV PW:         1.01 cm   LV E/e' medial:  12.4 LV IVS:        0.87 cm   LV e' lateral:   6.09 cm/s LVOT diam:     2.00 cm   LV E/e' lateral: 12.4 LV SV:         74 LV SV Index:   32 LVOT Area:     3.14 cm  RIGHT VENTRICLE RV Basal diam:  4.60 cm RV Mid diam:    3.80 cm RV S prime:     17.60 cm/s TAPSE (M-mode): 2.8 cm LEFT ATRIUM             Index        RIGHT ATRIUM           Index LA diam:        5.00 cm 2.17 cm/m   RA Area:     12.30 cm LA Vol  (A2C):   61.8 ml 26.77 ml/m  RA Volume:   23.00 ml  9.96 ml/m LA Vol (A4C):   64.3 ml 27.86 ml/m LA Biplane Vol: 65.9 ml 28.55 ml/m  AORTIC VALVE AV Area (Vmax):    3.20 cm AV Area (Vmean):   3.08 cm AV Area (VTI):     3.45 cm AV Vmax:           105.00 cm/s AV Vmean:          70.100 cm/s AV VTI:            0.213 m AV Peak Grad:      4.4 mmHg AV Mean Grad:      2.0 mmHg LVOT Vmax:         107.00 cm/s LVOT Vmean:        68.800 cm/s LVOT VTI:          0.234 m LVOT/AV VTI ratio: 1.10  AORTA Ao Root diam: 3.24 cm MITRAL VALVE MV Area (PHT): 3.24 cm    SHUNTS MV Area VTI:   2.74 cm    Systemic VTI:  0.23 m MV Peak grad:  3.9 mmHg    Systemic Diam: 2.00 cm MV Mean grad:  1.0 mmHg MV Vmax:       0.98 m/s MV Vmean:      53.3 cm/s MV Decel Time: 234 msec MV E velocity: 75.50 cm/s MV A velocity: 49.80 cm/s MV E/A ratio:  1.52 Cristal Deer End MD Electronically  signed by Yvonne Kendall MD Signature Date/Time: 08/09/2023/3:10:05 PM    Final    US Abdomen Limited RUQ (LIVER/GB)  Result Date: 08/08/2023 CLINICAL DATA:  Elevated transaminase level. EXAM: ULTRASOUND ABDOMEN LIMITED RIGHT UPPER QUADRANT COMPARISON:  None Available. FINDINGS: Gallbladder: Multiple small echogenic, mobile layering stones are seen measuring up to 5 mm. Negative sonographic Murphy's sign. Normal gallbladder wall thickness. No pericholecystic fluid. Common bile duct: Diameter: 5 mm, within normal limits. No intrahepatic or extrahepatic biliary ductal dilatation. Liver: No focal lesion identified. Within normal limits in parenchymal echogenicity. Portal vein is patent on color Doppler imaging with normal direction of blood flow towards the liver. Other: None. IMPRESSION: Cholelithiasis without sonographic evidence of acute cholecystitis. Electronically Signed   By: Neita Garnet M.D.   On: 08/08/2023 17:58   US Venous Img Lower Unilateral Left  Result Date: 08/08/2023 CLINICAL DATA:  Left lower extremity edema for the past 2 weeks. Evaluate  for DVT. EXAM: LEFT LOWER EXTREMITY VENOUS DOPPLER ULTRASOUND TECHNIQUE: Gray-scale sonography with graded compression, as well as color Doppler and duplex ultrasound were performed to evaluate the lower extremity deep venous systems from the level of the common femoral vein and including the common femoral, femoral, profunda femoral, popliteal and calf veins including the posterior tibial, peroneal and gastrocnemius veins when visible. The superficial great saphenous vein was also interrogated. Spectral Doppler was utilized to evaluate flow at rest and with distal augmentation maneuvers in the common femoral, femoral and popliteal veins. COMPARISON:  None Available. FINDINGS: Contralateral Common Femoral Vein: Respiratory phasicity is normal and symmetric with the symptomatic side. No evidence of thrombus. Normal compressibility. Common Femoral Vein: No evidence of thrombus. Normal compressibility, respiratory phasicity and response to augmentation. Saphenofemoral Junction: No evidence of thrombus. Normal compressibility and flow on color Doppler imaging. Profunda Femoral Vein: No evidence of thrombus. Normal compressibility and flow on color Doppler imaging. Femoral Vein: No evidence of thrombus. Normal compressibility, respiratory phasicity and response to augmentation. Popliteal Vein: No evidence of thrombus. Normal compressibility, respiratory phasicity and response to augmentation. Calf Veins: No evidence of thrombus. Normal compressibility and flow on color Doppler imaging. Superficial Great Saphenous Vein: No evidence of thrombus. Normal compressibility. Other Findings:  None. IMPRESSION: No evidence of DVT within the left lower extremity. Electronically Signed   By: Simonne Come M.D.   On: 08/08/2023 16:41   DG Chest 2 View  Result Date: 08/08/2023 CLINICAL DATA:  Shortness of breath. EXAM: CHEST - 2 VIEW COMPARISON:  None Available. FINDINGS: The heart size and mediastinal contours are within normal  limits. Both lungs are clear. The visualized skeletal structures are unremarkable. IMPRESSION: No active cardiopulmonary disease. Electronically Signed   By: Lupita Raider M.D.   On: 08/08/2023 15:59    Microbiology: Recent Results (from the past 240 hour(s))  MRSA Next Gen by PCR, Nasal     Status: None   Collection Time: 08/11/23  5:17 PM   Specimen: Nasal Mucosa; Nasal Swab  Result Value Ref Range Status   MRSA by PCR Next Gen NOT DETECTED NOT DETECTED Final    Comment: (NOTE) The GeneXpert MRSA Assay (FDA approved for NASAL specimens only), is one component of a comprehensive MRSA colonization surveillance program. It is not intended to diagnose MRSA infection nor to guide or monitor treatment for MRSA infections. Test performance is not FDA approved in patients less than 71 years old. Performed at [facility], [address], [place], [place] [address]   Culture, blood (Routine  X 2) w Reflex to ID Panel     Status: None   Collection Time: 08/11/23  7:08 PM   Specimen: BLOOD  Result Value Ref Range Status   Specimen Description BLOOD LEFT ANTECUBITAL  Final   Special Requests   Final    BOTTLES DRAWN AEROBIC AND ANAEROBIC Blood Culture adequate volume   Culture   Final    NO GROWTH 5 DAYS Performed at [facility], [address] [place], [place] [address]    Report Status 08/16/2023 FINAL  Final  Culture, blood (Routine X 2) w Reflex to ID Panel     Status: None   Collection Time: 08/11/23  7:08 PM   Specimen: BLOOD  Result Value Ref Range Status   Specimen Description BLOOD BLOOD LEFT HAND  Final   Special Requests   Final    BOTTLES DRAWN AEROBIC AND ANAEROBIC Blood Culture adequate volume   Culture   Final    NO GROWTH 5 DAYS Performed at [facility], [address] [place], [place] [address]    Report Status 08/16/2023 FINAL  Final  SARS Coronavirus 2 by RT PCR (hospital order, performed in [facility] hospital lab) *cepheid  single result test* Anterior Nasal Swab     Status: Abnormal   Collection Time: 08/14/23  7:53 AM   Specimen: Anterior Nasal Swab  Result Value Ref Range Status   SARS Coronavirus 2 by RT PCR POSITIVE (A) NEGATIVE Final    Comment: (NOTE) SARS-CoV-2 target nucleic acids are DETECTED  SARS-CoV-2 RNA is generally detectable in upper respiratory specimens  during the acute phase of infection.  Positive results are indicative  of the presence of the identified virus, but do not rule out bacterial infection or co-infection with other pathogens not detected by the test.  Clinical correlation with patient history and  other diagnostic information is necessary to determine patient infection status.  The expected result is negative.  Fact Sheet for Patients:   RoadLapTop.co.za   Fact Sheet for Healthcare Providers:   http://kim-miller.com/    This test is not yet approved or cleared by the [place] FDA and  has been authorized for detection and/or diagnosis of SARS-CoV-2 by FDA under an Emergency Use Authorization (EUA).  This EUA will remain in effect (meaning this test can be used) for the duration of  the COVID-19 declaration under Section 564(b)(1)  of the Act, 21 U.S.C. section 360-bbb-3(b)(1), unless the authorization is terminated or revoked sooner.   Performed at [facility], [address] [place], [place] [address]      Labs: CBC: Recent Labs  Lab 08/11/23 0430 08/11/23 1908 08/12/23 0441 08/13/23 0507 08/14/23 0439  WBC 5.4 7.7 5.1 5.1 4.5  NEUTROABS  --  6.7  --   --   --   HGB 12.5* 13.3 12.1* 11.8* 12.5*  HCT 38.6* 40.1 36.7* 36.0* 38.8*  MCV 69.8* 70.4* 70.3* 70.5* 70.7*  PLT 251 244 202 207 219   Basic Metabolic Panel: Recent Labs  Lab 08/10/23 0428 08/10/23 1802 08/11/23 1908 08/12/23 0026 08/12/23 0441 08/13/23 0507 08/14/23 0439 08/15/23 0550  NA 140   < > 138  --  143 143 136 134*   K 2.3*   < > 2.5*  --  3.1* 3.0* 2.9* 3.3*  CL 100   < > 98  --  105 108 102 102  CO2 29   < > 26  --  26 26 25 24   GLUCOSE  118*   < > 140*  --  130* 118* 116* 119*  BUN 6   < > 7  --  11 10 9 10   CREATININE 0.70   < > 0.85  --  0.99 0.72 0.72 0.69  CALCIUM 8.0*   < > 8.1*  --  8.1* 7.9* 7.9* 7.8*  MG 2.0  --   --  1.6*  --  2.0 1.9 1.8  PHOS 3.1  --   --   --  3.2 2.6 2.3* 3.2   < > = values in this interval not displayed.   Liver Function Tests: Recent Labs  Lab 08/11/23 1908 08/12/23 0441 08/13/23 0507 08/14/23 0439 08/15/23 0550  AST 234* 228* 252* 230* 129*  ALT 136* 122* 127* 132* 106*  ALKPHOS 42 35* 33* 34* 38  BILITOT 3.9* 3.2* 1.2 1.1 0.8  PROT 6.9 6.1* 5.9* 6.1* 5.8*  ALBUMIN 3.8 3.2* 3.0* 3.3* 2.9*   No results for input(s): "LIPASE", "AMYLASE" in the last 168 hours. No results for input(s): "AMMONIA" in the last 168 hours. Cardiac Enzymes: No results for input(s): "CKTOTAL", "CKMB", "CKMBINDEX", "TROPONINI" in the last 168 hours. BNP (last 3 results) Recent Labs    08/08/23 1329  BNP 477.1*   CBG: Recent Labs  Lab 08/11/23 0806 08/11/23 1235 08/11/23 1702 08/13/23 1152 08/14/23 1148  GLUCAP 127* 131* 127* 177* 244*    Time spent: 35 minutes  Signed:  Gillis Santa  [facility] 08/15/2023  9:39 PM

## 2023-08-20 LAB — ALDOSTERONE + RENIN ACTIVITY W/ RATIO
ALDO / PRA Ratio: UNDETERMINED
Aldosterone: <1 ng/dL (ref 0.0–30.0)
PRA LC/MS/MS: <0.167 ng/mL/h — ABNORMAL LOW (ref 0.167–5.380)

## 2023-08-25 ENCOUNTER — Ambulatory Visit: Payer: BC Managed Care – PPO | Attending: Family | Admitting: Cardiology

## 2023-08-25 ENCOUNTER — Ambulatory Visit: Payer: BC Managed Care – PPO | Admitting: Family Medicine

## 2023-08-25 ENCOUNTER — Encounter: Payer: Self-pay | Admitting: Family Medicine

## 2023-08-25 VITALS — BP 170/110 | HR 74 | Temp 98.1°F | Ht 68.0 in | Wt 266.4 lb

## 2023-08-25 VITALS — BP 132/60 | HR 76 | Ht 68.0 in | Wt 264.0 lb

## 2023-08-25 DIAGNOSIS — E2609 Other primary hyperaldosteronism: Secondary | ICD-10-CM | POA: Diagnosis not present

## 2023-08-25 DIAGNOSIS — E0859 Diabetes mellitus due to underlying condition with other circulatory complications: Secondary | ICD-10-CM | POA: Diagnosis not present

## 2023-08-25 DIAGNOSIS — E876 Hypokalemia: Secondary | ICD-10-CM

## 2023-08-25 DIAGNOSIS — I43 Cardiomyopathy in diseases classified elsewhere: Secondary | ICD-10-CM | POA: Diagnosis not present

## 2023-08-25 DIAGNOSIS — I110 Hypertensive heart disease with heart failure: Secondary | ICD-10-CM | POA: Diagnosis not present

## 2023-08-25 DIAGNOSIS — Z23 Encounter for immunization: Secondary | ICD-10-CM | POA: Diagnosis not present

## 2023-08-25 DIAGNOSIS — E66813 Obesity, class 3: Secondary | ICD-10-CM

## 2023-08-25 DIAGNOSIS — Z6841 Body Mass Index (BMI) 40.0 and over, adult: Secondary | ICD-10-CM

## 2023-08-25 DIAGNOSIS — Z7689 Persons encountering health services in other specified circumstances: Secondary | ICD-10-CM

## 2023-08-25 DIAGNOSIS — R600 Localized edema: Secondary | ICD-10-CM | POA: Diagnosis not present

## 2023-08-25 DIAGNOSIS — Z7985 Long-term (current) use of injectable non-insulin antidiabetic drugs: Secondary | ICD-10-CM

## 2023-08-25 DIAGNOSIS — I1A0 Resistant hypertension: Secondary | ICD-10-CM

## 2023-08-25 LAB — POCT URINALYSIS DIPSTICK
Bilirubin, UA: NEGATIVE
Blood, UA: NEGATIVE
Glucose, UA: POSITIVE — AB
Ketones, UA: NEGATIVE
Leukocytes, UA: NEGATIVE
Nitrite, UA: NEGATIVE
Protein, UA: NEGATIVE
Spec Grav, UA: 1.015 (ref 1.010–1.025)
Urobilinogen, UA: 0.2 U/dL
pH, UA: 7 (ref 5.0–8.0)

## 2023-08-25 MED ORDER — OZEMPIC (0.25 OR 0.5 MG/DOSE) 2 MG/1.5ML ~~LOC~~ SOPN: 1.0000 mg | PEN_INJECTOR | SUBCUTANEOUS | 2 refills | Status: DC

## 2023-08-25 NOTE — Patient Instructions (Signed)
Medication Changes:  No changes were made to your medications today.   Referrals:  Your provider has ordered a referral to [facility]. Their office will call you to schedule.   Special Instructions // Education:  Do the following things EVERYDAY: Weigh yourself in the morning before breakfast. Write it down and keep it in a log. Take your medicines as prescribed Eat low salt foods--Limit salt (sodium) to 2000 mg per day.  Stay as active as you can everyday Limit all fluids for the day to less than 2 liters   Follow-Up in: 4 months with Dr. Elwyn Lade. Please call the office to schedule this appointment.    If you have any questions or concerns before your next appointment please send [place] a message through [place] or call our office at [phone] Monday-Friday 8 am-5 pm.   If you have an urgent need after hours on the weekend please call your Primary Cardiologist or the Advanced [facility] in [place] at [phone].

## 2023-08-25 NOTE — Progress Notes (Signed)
ADVANCED HEART FAILURE NEW PATIENT CLINIC NOTE  Referring Physician: [name], NP  Primary Care: Logan Hose, NP Primary Cardiologist:  HPI: Logan Harrell is a 57 y.o. male with a PMH of hypertension, hypokalemia, chronic mifepristone use, recent COVID infection who presents for initial visit for further evaluation and treatment of heart failure/cardiomyopathy.     Patient was recently admitted to the hospital after he presented for evaluation of lower extremity swelling.  Lab work at that time revealed an undetectable potassium and severely elevated blood pressure.  He also had elevated LFTs and given some ongoing abdominal pain underwent workup for cholecystitis that was negative including a HIDA scan.  He also had a concomitant COVID infection.  His blood pressure medications were uptitrated and he was discharged with mild improvement in his lower extremity swelling.       SUBJECTIVE: Patient reports that he is felt overall well since being admitted to the hospital.  Unfortunately, he has not been able to return to work given that he needed a doctor to sign off on his paperwork.  He reports compliance with his blood pressure medications.  Has not been regularly taking his blood pressure at home due to needing to replace batteries on his blood pressure cuff.  However, he reports that his blood pressure fluctuates at home versus the office.  He normally has normal blood pressure in the office setting but elevated at home.  His blood pressure is controlled today and his medications were recently increased.  During his prior hospital stay there was an adrenal nodule found on his left adrenal gland.  This was not worked up further.  He was supposed to see an endocrinologist but the appointment was canceled due to the provider not accepting new patients.  He reports a 30 to 40 pound intentional weight loss over the last few months.  Was recently restarted on Ozempic.  PMH, current medications,  allergies, social history, and family history reviewed in epic.  PHYSICAL EXAM: There were no vitals filed for this visit. GENERAL: Well nourished and in no apparent distress at rest, mild obesity HEENT: Negative for xanthelasma. There is no scleral icterus.  The mucous membranes are pink and moist.   CHEST: There are no chest wall deformities. There is no chest wall tenderness. Respirations are unlabored.  Lungs- Clear to auscultation bilaterally, normal work of breathing. CARDIAC:  JVP: Not visible         Normal rate with regular rhythm. No murmurs, rubs or gallops.  No lower extremity edema ABDOMEN: Soft, non-tender, non-distended. Marland Kitchen  EXTREMITIES: Warm and well perfused with no cyanosis, clubbing.  NEUROLOGIC: Patient is oriented x3 with no focal or lateralizing neurologic deficits.  PSYCH: Patients affect is appropriate, there is no evidence of anxiety or depression.  SKIN: Warm and dry; no lesions or wounds.   DATA REVIEW  ECG: Sinus rhythm with LVH  ECHO: Normal EF, Grade II DD, no effusion, normal LA size. As per my personal interpretation  CATH: None    ASSESSMENT & PLAN:  1: Heart failure with preserved EF: Well compensated, most likely etiology is uncontrolled hypertension.  BP better controlled today with recent increase in blood pressure medication, may need further titration at follow-up.  Appears euvolemic and avoiding diuretics given recent hypokalemia. -Continue lisinopril 40, would consider increase to 40 mg twice daily if needed -Continue amlodipine 5 mg daily -Continue carvedilol 12.5 mg twice daily -Continue spironolactone 100 mg daily -No cardiac reason he cannot return to  work  2: Resistant hypertension/hypokalemia: Patient presented with severe range hypertension as well as undetectable potassium.  His renin and aldosterone were both undetectable, although in the setting of MRA as well as mifepristone treatment.  While these laboratory values and blood  pressures may be explained by mifepristone, given the adrenal nodule seen he would benefit from further workup with endocrinology.  -Endocrine referral, urgent -No benefit to repeating renin/aldosterone while on MRA   Clearnce Hasten, MD Advanced Heart Failure Mechanical Circulatory Support 08/25/23

## 2023-08-25 NOTE — Progress Notes (Signed)
I,Jameka J Llittleton, CMA,acting as a Neurosurgeon for [employer], NP.,have documented all relevant documentation on the behalf of [name], NP,as directed by  [name], NP while in the presence of [name], NP.  Subjective:  Patient ID: Logan Harrell , male    DOB: 05/13/1966 , 57 y.o.   MRN: [id number]  Chief Complaint  Patient presents with   Establish Care   Diabetes   Hypertension    HPI  Patient presents today to establish primary care. He reports his previous pcp just retired, he reported . Patient has a diagnosis of hypertension and diabetes. Patient reports he was recently in the hospital  for about a week, where he was treated for Bilateral lower extremity edema with elevated BNP, Hypokalemia and elevated blood pressure. At the hospital,  patient had adjustment to his BP medications and was referred to cardiology today in [place] at 2pm. Initially BP was elevated but patient states, he did not take his medication before coming to the clinic     Past Medical History:  Diagnosis Date   Anemia    Arthritis    Asthma    COPD (chronic obstructive pulmonary disease) (HCC) 02/20   Diabetes mellitus without complication (HCC)    Hyperlipidemia    Hypertension    Sleep apnea      Family History  Problem Relation Age of Onset   Diabetes Mother    Kidney disease Mother    Obesity Mother    Diabetes Father    Obesity Father    Stroke Father      Current Outpatient Medications:    amLODipine (NORVASC) 5 MG tablet, Take 1 tablet (5 mg total) by mouth every evening., Disp: 30 tablet, Rfl: 2   aspirin EC 81 MG tablet, Take 81 mg by mouth daily., Disp: , Rfl:    carvedilol (COREG) 12.5 MG tablet, Take 1 tablet (12.5 mg total) by mouth 2 (two) times daily with a meal., Disp: 60 tablet, Rfl: 11   empagliflozin (JARDIANCE) 25 MG TABS tablet, Take 25 mg by mouth daily., Disp: , Rfl:    JANUMET XR 50-1000 MG TB24, Take 1 tablet by mouth daily., Disp: , Rfl:    lisinopril  (ZESTRIL) 40 MG tablet, Take 1 tablet (40 mg total) by mouth daily., Disp: 30 tablet, Rfl: 11   Multiple Vitamin (MULTIVITAMIN) tablet, Take 1 tablet by mouth daily., Disp: , Rfl:    rosuvastatin (CRESTOR) 10 MG tablet, Take 10 mg by mouth daily., Disp: , Rfl:    spironolactone (ALDACTONE) 100 MG tablet, Take 1 tablet (100 mg total) by mouth daily., Disp: 30 tablet, Rfl: 11   vitamin C (ASCORBIC ACID) 500 MG tablet, Take 500 mg by mouth daily., Disp: , Rfl:    VITAMIN D, CHOLECALCIFEROL, PO, Take 1-2 capsules by mouth daily., Disp: , Rfl:    Semaglutide, 1 MG/DOSE, (OZEMPIC, 1 MG/DOSE,) 4 MG/3ML SOPN, Inject 1 mg into the skin every 7 (seven) days., Disp: 3 mL, Rfl: 2   No Known Allergies   Review of Systems  Constitutional: Negative.   HENT: Negative.    Respiratory: Negative.    Cardiovascular:  Positive for leg swelling. Negative for chest pain and palpitations.  Gastrointestinal: Negative.   Endocrine: Negative.   Genitourinary: Negative.   Musculoskeletal: Negative.   Psychiatric/Behavioral: Negative.       Today's Vitals   08/25/23 1011  BP: (!) 170/110  Pulse: 74  Temp: 98.1 F (36.7 C)  Weight: 266 lb 6.4  oz (120.8 kg)  Height: 5\' 8"  (1.727 m)  PainSc: 0-No pain   Body mass index is 40.51 kg/m.  Wt Readings from Last 3 Encounters:  09/01/23 264 lb (119.7 kg)  08/25/23 264 lb (119.7 kg)  08/25/23 266 lb 6.4 oz (120.8 kg)      Objective:  Physical Exam Constitutional:      Appearance: Normal appearance.  HENT:     Head: Normocephalic.  Cardiovascular:     Rate and Rhythm: Normal rate and regular rhythm.     Pulses: Normal pulses.     Heart sounds: Normal heart sounds.  Pulmonary:     Effort: Pulmonary effort is normal.     Breath sounds: Normal breath sounds.  Abdominal:     General: Bowel sounds are normal.  Musculoskeletal:     Right lower leg: 1+ Pitting Edema present.     Left lower leg: 1+ Pitting Edema present.  Neurological:     Mental Status:  He is alert.         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 Restart ozempic 1mg  Q Weekly.   Immunization due -     Flu vaccine trivalent PF, 6mos and older(Flulaval,Afluria,Fluarix,Fluzone)  Hypertensive cardiomyopathy, with heart failure [facility]) Assessment & Plan: Had appointment with HF clinic 08/25/2023  Orders: -     POCT urinalysis dipstick -     Microalbumin / creatinine urine ratio -     CMP14+EGFR  Class 3 severe obesity due to excess calories with body mass index (BMI) of 40.0 to 44.9 in adult, unspecified whether serious comorbidity present [facility]) Assessment & Plan: He is encouraged to strive for BMI less than 30 to decrease cardiac risk. Advised to aim for at least 150 minutes of exercise per week.      Return in 1 week (on 09/01/2023) for Uncontrolled BP check-3 months, nurse visit for BP check.  Patient was given opportunity to ask questions. Patient verbalized understanding of the plan and was able to repeat key elements of the plan. All questions were answered to their satisfaction.    I, [name], NP, have reviewed all documentation for this visit. The documentation on 08/25/23 for the exam, diagnosis, procedures, and orders are all accurate and complete.    IF YOU HAVE BEEN REFERRED TO A SPECIALIST, IT MAY TAKE 1-2 WEEKS TO SCHEDULE/PROCESS THE REFERRAL. IF YOU HAVE NOT HEARD FROM US/SPECIALIST IN TWO WEEKS, PLEASE GIVE [place] A CALL AT [phone].

## 2023-08-25 NOTE — Progress Notes (Deleted)
Advanced Heart Failure Clinic Note   Referring Physician: recent admission PCP: [facility] 09/24 Cardiologist: None   HPI:  Logan Harrell is a 57 y/o male with a history of   Admitted 08/08/23 due to increased bilateral leg swelling over 2 months more so for 2 weeks, intermittent abdominal pain, decreased p.o. intake, nausea, vomiting and weight loss. Patient is taking mifepristone (steroids for high blood sugars) no history of Cushing syndrome though. Started on Cardene drip. He also had elevated LFT. Imaging showed cholelithiasis but HIDA scan negative. 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 F/u with endocrinology. GI consulted and esophagram/upper gi study on 8/23 wnl. Tested + for covid 08/14/23.    Echo 08/09/23: EF 55-60% with Grade II DD  He presents today for his initial visit with a chief complaint of   Review of Systems: [y] = yes, [ ]  = no   General: Weight gain [ ] ; Weight loss [ ] ; Anorexia [ ] ; Fatigue [ ] ; Fever [ ] ; Chills [ ] ; Weakness [ ]   Cardiac: Chest pain/pressure [ ] ; Resting SOB [ ] ; Exertional SOB [ ] ; Orthopnea [ ] ; Pedal Edema [ ] ; Palpitations [ ] ; Syncope [ ] ; Presyncope [ ] ; Paroxysmal nocturnal dyspnea[ ]   Pulmonary: Cough [ ] ; Wheezing[ ] ; Hemoptysis[ ] ; Sputum [ ] ; Snoring [ ]   GI: Vomiting[ ] ; Dysphagia[ ] ; Melena[ ] ; Hematochezia [ ] ; Heartburn[ ] ; Abdominal pain [ ] ; Constipation [ ] ; Diarrhea [ ] ; BRBPR [ ]   GU: Hematuria[ ] ; Dysuria [ ] ; Nocturia[ ]   Vascular: Pain in legs with walking [ ] ; Pain in feet with lying flat [ ] ; Non-healing sores [ ] ; Stroke [ ] ; TIA [ ] ; Slurred speech [ ] ;  Neuro: Headaches[ ] ; Vertigo[ ] ; Seizures[ ] ; Paresthesias[ ] ;Blurred vision [ ] ; Diplopia [ ] ; Vision changes [ ]   Ortho/Skin: Arthritis [ ] ; Joint pain [ ] ; Muscle pain [ ] ; Joint swelling [ ] ; Back Pain [ ] ; Rash [ ]   Psych: Depression[ ] ; Anxiety[ ]   Heme: Bleeding problems [ ] ;  Clotting disorders [ ] ; Anemia [ ]   Endocrine: Diabetes [ ] ; Thyroid dysfunction[ ]    Past Medical History:  Diagnosis Date   Arthritis    Diabetes mellitus without complication (HCC)    Hyperlipidemia    Hypertension    Sleep apnea     Current Outpatient Medications  Medication Sig Dispense Refill   amLODipine (NORVASC) 5 MG tablet Take 1 tablet (5 mg total) by mouth every evening. 30 tablet 2   aspirin EC 81 MG tablet Take 81 mg by mouth daily.     carvedilol (COREG) 12.5 MG tablet Take 1 tablet (12.5 mg total) by mouth 2 (two) times daily with a meal. 60 tablet 11   empagliflozin (JARDIANCE) 25 MG TABS tablet Take 25 mg by mouth daily.     JANUMET XR 50-1000 MG TB24 Take 1 tablet by mouth daily.     lisinopril (ZESTRIL) 40 MG tablet Take 1 tablet (40 mg total) by mouth daily. 30 tablet 11   Multiple Vitamin (MULTIVITAMIN) tablet Take 1 tablet by mouth daily.     rosuvastatin (CRESTOR) 10 MG tablet Take 10 mg by mouth daily.     Semaglutide (OZEMPIC) 0.25 or 0.5 MG/DOSE SOPN Inject 1 mg into the skin once a week.      spironolactone (ALDACTONE) 100 MG tablet Take 1 tablet (100 mg total) by mouth daily. 30 tablet 11  vitamin C (ASCORBIC ACID) 500 MG tablet Take 500 mg by mouth daily.     VITAMIN D, CHOLECALCIFEROL, PO Take 1-2 capsules by mouth daily.     No current facility-administered medications for this visit.    No Known Allergies    Social History   Socioeconomic History   Marital status: Married    Spouse name: Not on file   Number of children: Not on file   Years of education: Not on file   Highest education level: Some college, no degree  Occupational History   Not on file  Tobacco Use   Smoking status: Every Day    Current packs/day: 1.00    Average packs/day: 1 pack/day for 35.0 years (35.0 ttl pk-yrs)    Types: Cigarettes   Smokeless tobacco: Never  Vaping Use   Vaping status: Never Used  Substance and Sexual Activity   Alcohol use: Yes     Alcohol/week: 4.0 standard drinks of alcohol    Types: 4 Cans of beer per week   Drug use: Never   Sexual activity: Yes    Birth control/protection: None  Other Topics Concern   Not on file  Social History Narrative   Not on file   Social Determinants of Health   Financial Resource Strain: High Risk (08/24/2023)   Overall Financial Resource Strain (CARDIA)    Difficulty of Paying Living Expenses: Hard  Food Insecurity: Food Insecurity Present (08/24/2023)   Hunger Vital Sign    Worried About Running Out of Food in the Last Year: Sometimes true    Ran Out of Food in the Last Year: Sometimes true  Transportation Needs: No Transportation Needs (08/24/2023)   PRAPARE - Administrator, Civil Service (Medical): No    Lack of Transportation (Non-Medical): No  Physical Activity: Inactive (08/24/2023)   Exercise Vital Sign    Days of Exercise per Week: 6 days    Minutes of Exercise per Session: 0 min  Stress: Stress Concern Present (08/24/2023)   [employer] of Occupational Health - Occupational Stress Questionnaire    Feeling of Stress : To some extent  Social Connections: Moderately Isolated (08/24/2023)   Social Connection and Isolation Panel [NHANES]    Frequency of Communication with Friends and Family: Twice a week    Frequency of Social Gatherings with Friends and Family: Once a week    Attends Religious Services: Never    Database administrator or Organizations: No    Attends Engineer, structural: Not on file    Marital Status: Married  Catering manager Violence: Not At Risk (08/08/2023)   Humiliation, Afraid, Rape, and Kick questionnaire    Fear of Current or Ex-Partner: No    Emotionally Abused: No    Physically Abused: No    Sexually Abused: No      Family History  Problem Relation Age of Onset   Diabetes Mother    Diabetes Father        PHYSICAL EXAM: General:  Well appearing. No respiratory difficulty HEENT: normal Neck: supple. no JVD.  Carotids 2+ bilat; no bruits. No lymphadenopathy or thyromegaly appreciated. Cor: PMI nondisplaced. Regular rate & rhythm. No rubs, gallops or murmurs. Lungs: clear Abdomen: soft, nontender, nondistended. No hepatosplenomegaly. No bruits or masses. Good bowel sounds. Extremities: no cyanosis, clubbing, rash, edema Neuro: alert & oriented x 3, cranial nerves grossly intact. moves all 4 extremities w/o difficulty. Affect pleasant.  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

## 2023-08-26 LAB — CMP14+EGFR
ALT: 35 IU/L (ref 0–44)
AST: 34 IU/L (ref 0–40)
Albumin: 4.4 g/dL (ref 3.8–4.9)
Alkaline Phosphatase: 81 IU/L (ref 44–121)
BUN/Creatinine Ratio: 13 (ref 9–20)
BUN: 11 mg/dL (ref 6–24)
Bilirubin Total: 1.1 mg/dL (ref 0.0–1.2)
CO2: 24 mmol/L (ref 20–29)
Calcium: 9.4 mg/dL (ref 8.7–10.2)
Chloride: 98 mmol/L (ref 96–106)
Creatinine, Ser: 0.83 mg/dL (ref 0.76–1.27)
Globulin, Total: 2.9 g/dL (ref 1.5–4.5)
Glucose: 124 mg/dL — ABNORMAL HIGH (ref 70–99)
Potassium: 5.9 mmol/L — ABNORMAL HIGH (ref 3.5–5.2)
Sodium: 135 mmol/L (ref 134–144)
Total Protein: 7.3 g/dL (ref 6.0–8.5)
eGFR: 101 mL/min/{1.73_m2} (ref >59)

## 2023-08-26 LAB — MICROALBUMIN / CREATININE URINE RATIO
Creatinine, Urine: 36.4 mg/dL
Microalb/Creat Ratio: 136 mg/g{creat} — ABNORMAL HIGH (ref 0–29)
Microalbumin, Urine: 49.5 ug/mL

## 2023-08-28 ENCOUNTER — Other Ambulatory Visit: Payer: Self-pay | Admitting: Family Medicine

## 2023-08-29 LAB — BRAIN NATRIURETIC PEPTIDE

## 2023-09-01 ENCOUNTER — Ambulatory Visit (INDEPENDENT_AMBULATORY_CARE_PROVIDER_SITE_OTHER): Payer: BC Managed Care – PPO

## 2023-09-01 ENCOUNTER — Other Ambulatory Visit: Payer: Self-pay | Admitting: Family Medicine

## 2023-09-01 VITALS — BP 140/78 | HR 73 | Temp 98.6°F | Ht 68.0 in | Wt 264.0 lb

## 2023-09-01 DIAGNOSIS — Z23 Encounter for immunization: Secondary | ICD-10-CM

## 2023-09-01 DIAGNOSIS — I1A0 Resistant hypertension: Secondary | ICD-10-CM

## 2023-09-01 NOTE — Progress Notes (Signed)
Patient presents today for  bp check. He is taking carvedilol 12.5mg  and lisinopril 40mg  in the mornings and amlodipine 5mg  in the evenings. Patient reports compliance with his meds. Patient also would like a covid vaccine today. I checked his bp today and it was 148/82. I waited 10 minutes and checked again and it was 140/78 p 73.   BP Readings from Last 3 Encounters:  09/01/23 (!) 148/82  08/25/23 132/60  08/25/23 (!) 170/110

## 2023-09-01 NOTE — Progress Notes (Signed)
I,Kail Fraley T Eydan Chianese, CMA,acting as a Neurosurgeon for [employer] documented all relevant documentation on the behalf of TIMA-NURSE,as directed by  [facility] while in the presence of TIMA-NURSE.  Subjective:  Patient ID: Logan Harrell , male    DOB: [birth year] , 57 y.o.   MRN: [id number]  No chief complaint on file.   HPI  HPI   Past Medical History:  Diagnosis Date   Anemia    Arthritis    Asthma    COPD (chronic obstructive pulmonary disease) (HCC) 02/20   Diabetes mellitus without complication (HCC)    Hyperlipidemia    Hypertension    Sleep apnea      Family History  Problem Relation Age of Onset   Diabetes Mother    Kidney disease Mother    Obesity Mother    Diabetes Father    Obesity Father    Stroke Father      Current Outpatient Medications:    amLODipine (NORVASC) 5 MG tablet, Take 1 tablet (5 mg total) by mouth every evening., Disp: 30 tablet, Rfl: 2   aspirin EC 81 MG tablet, Take 81 mg by mouth daily., Disp: , Rfl:    carvedilol (COREG) 12.5 MG tablet, Take 1 tablet (12.5 mg total) by mouth 2 (two) times daily with a meal., Disp: 60 tablet, Rfl: 11   empagliflozin (JARDIANCE) 25 MG TABS tablet, Take 25 mg by mouth daily., Disp: , Rfl:    JANUMET XR 50-1000 MG TB24, Take 1 tablet by mouth daily., Disp: , Rfl:    lisinopril (ZESTRIL) 40 MG tablet, Take 1 tablet (40 mg total) by mouth daily., Disp: 30 tablet, Rfl: 11   Multiple Vitamin (MULTIVITAMIN) tablet, Take 1 tablet by mouth daily., Disp: , Rfl:    rosuvastatin (CRESTOR) 10 MG tablet, Take 10 mg by mouth daily., Disp: , Rfl:    Semaglutide, 1 MG/DOSE, (OZEMPIC, 1 MG/DOSE,) 4 MG/3ML SOPN, Inject 1 mg into the skin every 7 (seven) days., Disp: 3 mL, Rfl: 2   spironolactone (ALDACTONE) 100 MG tablet, Take 1 tablet (100 mg total) by mouth daily., Disp: 30 tablet, Rfl: 11   vitamin C (ASCORBIC ACID) 500 MG tablet, Take 500 mg by mouth daily., Disp: , Rfl:    VITAMIN D, CHOLECALCIFEROL, PO, Take 1-2 capsules by  mouth daily., Disp: , Rfl:    No Known Allergies   Review of Systems   There were no vitals filed for this visit. There is no height or weight on file to calculate BMI.  Wt Readings from Last 3 Encounters:  08/25/23 264 lb (119.7 kg)  08/25/23 266 lb 6.4 oz (120.8 kg)  08/11/23 252 lb 6.8 oz (114.5 kg)     Objective:  Physical Exam      Assessment And Plan:  There are no diagnoses linked to this encounter.   No follow-ups on file.  Patient was given opportunity to ask questions. Patient verbalized understanding of the plan and was able to repeat key elements of the plan. All questions were answered to their satisfaction.  TIMA-NURSE  I, TIMA-NURSE, have reviewed all documentation for this visit. The documentation on 09/01/23 for the exam, diagnosis, procedures, and orders are all accurate and complete.   IF YOU HAVE BEEN REFERRED TO A SPECIALIST, IT MAY TAKE 1-2 WEEKS TO SCHEDULE/PROCESS THE REFERRAL. IF YOU HAVE NOT HEARD FROM US/SPECIALIST IN TWO WEEKS, PLEASE GIVE [place] A CALL AT [phone].   THE PATIENT IS ENCOURAGED TO PRACTICE SOCIAL DISTANCING DUE TO THE  COVID-19 PANDEMIC.

## 2023-09-07 DIAGNOSIS — I1A0 Resistant hypertension: Secondary | ICD-10-CM | POA: Insufficient documentation

## 2023-09-07 DIAGNOSIS — I110 Hypertensive heart disease with heart failure: Secondary | ICD-10-CM | POA: Insufficient documentation

## 2023-09-07 DIAGNOSIS — E0859 Diabetes mellitus due to underlying condition with other circulatory complications: Secondary | ICD-10-CM | POA: Insufficient documentation

## 2023-09-07 DIAGNOSIS — E66813 Obesity, class 3: Secondary | ICD-10-CM | POA: Insufficient documentation

## 2023-09-07 NOTE — Assessment & Plan Note (Signed)
He is encouraged to strive for BMI less than 30 to decrease cardiac risk. Advised to aim for at least 150 minutes of exercise per week.

## 2023-09-07 NOTE — Assessment & Plan Note (Signed)
Chronic. Restart ozempic 1mg  Q Weekly.

## 2023-09-07 NOTE — Assessment & Plan Note (Signed)
Had appointment with HF clinic 08/25/2023

## 2023-09-07 NOTE — Assessment & Plan Note (Addendum)
Low salt diet advised

## 2023-09-07 NOTE — Assessment & Plan Note (Signed)
Recheck lab

## 2023-09-09 ENCOUNTER — Encounter: Payer: Self-pay | Admitting: Family Medicine

## 2023-09-22 ENCOUNTER — Encounter: Payer: Self-pay | Admitting: Family Medicine

## 2023-09-26 ENCOUNTER — Other Ambulatory Visit: Payer: Self-pay | Admitting: Endocrinology

## 2023-09-26 DIAGNOSIS — E279 Disorder of adrenal gland, unspecified: Secondary | ICD-10-CM

## 2023-10-12 ENCOUNTER — Other Ambulatory Visit: Payer: Self-pay | Admitting: Family Medicine

## 2023-10-12 DIAGNOSIS — E2609 Other primary hyperaldosteronism: Secondary | ICD-10-CM

## 2023-10-28 NOTE — Progress Notes (Signed)
SCANNED DOCUMENT

## 2023-11-24 ENCOUNTER — Ambulatory Visit: Payer: Self-pay | Admitting: Family Medicine

## 2023-12-09 ENCOUNTER — Other Ambulatory Visit: Payer: Self-pay | Admitting: Family Medicine

## 2023-12-09 ENCOUNTER — Telehealth: Payer: Self-pay | Admitting: Cardiology

## 2023-12-09 NOTE — Telephone Encounter (Signed)
Pt confirmed appt for 12/12/23

## 2023-12-12 ENCOUNTER — Ambulatory Visit: Payer: BC Managed Care – PPO | Attending: Cardiology | Admitting: Cardiology

## 2023-12-12 VITALS — BP 139/75 | HR 67 | Wt 304.4 lb

## 2023-12-12 DIAGNOSIS — I43 Cardiomyopathy in diseases classified elsewhere: Secondary | ICD-10-CM

## 2023-12-12 DIAGNOSIS — I1A0 Resistant hypertension: Secondary | ICD-10-CM

## 2023-12-12 DIAGNOSIS — I110 Hypertensive heart disease with heart failure: Secondary | ICD-10-CM | POA: Diagnosis not present

## 2023-12-12 NOTE — Patient Instructions (Addendum)
You do not need to follow up with the heart failure clinic again at this time.  Dr. Elwyn Lade recommends you follow up with a general cardiologist in 6 months and call our office if needed for any future needs.

## 2023-12-12 NOTE — Progress Notes (Unsigned)
   ADVANCED HEART FAILURE FOLLOW UP CLINIC NOTE  Referring Physician: [name], NP  Primary Care: [name], NP Primary Cardiologist:  HPI: Logan Harrell is a 57 y.o. male with a PMH of hypertension, hypokalemia, chronic mifepristone use, recent COVID infection who presents for follow up of hypertension.        Patient was recently admitted to the hospital after he presented for evaluation of lower extremity swelling.  Lab work at that time revealed an undetectable potassium and severely elevated blood pressure.  He also had elevated LFTs and given some ongoing abdominal pain underwent workup for cholecystitis that was negative including a HIDA scan.  He also had a concomitant COVID infection.  His blood pressure medications were uptitrated and he was discharged with mild improvement in his lower extremity swelling.  Off the mifepristone he had a repeat CT scan that did not show evidence of any adrenal nodules.         SUBJECTIVE:  Patient overall reports that he has been doing much better since his blood pressure has been controlled.  His electrolytes have stabilized and he has not had any further swelling, shortness of breath, fatigue.  He reports no cardiac complaints and follows with his PCP for blood pressure recommendations.  He checks his blood pressure at home and is usually in the 120s.  PMH, current medications, allergies, social history, and family history reviewed in epic.  PHYSICAL EXAM: Vitals:   12/12/23 0907  BP: 139/75  Pulse: 67  SpO2: 97%   GENERAL: Well nourished and in no apparent distress at rest.  HEENT: The mucous membranes are pink and moist.   PULM:  Normal work of breathing, clear to auscultation bilaterally. Respirations are unlabored.  CARDIAC:  JVP: Not elevated         Normal rate with regular rhythm. No murmurs, rubs or gallops.  Trace lower extremity edema.  ABDOMEN: Soft, non-tender, non-distended. NEUROLOGIC: Patient is oriented x3  with no focal or lateralizing neurologic deficits.  PSYCH: Patients affect is appropriate, there is no evidence of anxiety or depression.  SKIN: Warm and dry; no lesions or wounds. Warm and well perfused extremities.  DATA REVIEW  ECG: Sinus rhythm with LVH   ECHO: Normal EF, Grade II DD, no effusion, normal LA size. As per my personal interpretation   CATH: None    ASSESSMENT & PLAN:  Heart failure with preserved EF: Well compensated, most likely etiology is uncontrolled hypertension.  Now that blood pressure is controlled he has no further symptoms of heart failure and reports that he is doing well.  No further titration today, can follow-up with general cardiology. -Continue lisinopril 40mg  daily -Continue amlodipine 5 mg daily -Continue carvedilol 12.5 mg twice daily -Continue spironolactone 100 mg daily -No cardiac reason he cannot return to work   2: Resistant hypertension/hypokalemia: Patient presented with severe range hypertension as well as undetectable potassium.  His renin and aldosterone were both undetectable, although in the setting of MRA as well as mifepristone treatment.  Abnormalities likely explained by mifepristone treatment, no evidence of adrenal nodule on repeat scan. -Improved on most recent labs -Hypertension better controlled    Clearnce Hasten, MD Advanced Heart Failure Mechanical Circulatory Support 12/13/23

## 2024-01-11 ENCOUNTER — Other Ambulatory Visit: Payer: Self-pay | Admitting: Family Medicine

## 2024-01-11 DIAGNOSIS — R918 Other nonspecific abnormal finding of lung field: Secondary | ICD-10-CM

## 2024-01-19 ENCOUNTER — Ambulatory Visit
Admission: RE | Admit: 2024-01-19 | Discharge: 2024-01-19 | Disposition: A | Discharge status: Home / Self Care | Payer: BC Managed Care – PPO | Source: Ambulatory Visit | Attending: Family Medicine | Admitting: Family Medicine

## 2024-01-19 DIAGNOSIS — R918 Other nonspecific abnormal finding of lung field: Secondary | ICD-10-CM

## 2024-01-19 MED ORDER — IOPAMIDOL (ISOVUE-300) INJECTION 61%
75.0000 mL | Freq: Once | INTRAVENOUS | Status: AC | PRN
  Administered 2024-01-19: 100 mL via INTRAVENOUS

## 2024-01-20 ENCOUNTER — Encounter: Payer: Self-pay | Admitting: Gastroenterology

## 2024-01-26 NOTE — H&P (Addendum)
 Pre-Procedure H&P   Patient ID: Logan Harrell is a 58 y.o. male.  Gastroenterology Provider: [name], DO  Referring Provider: Luke Barefoot, NP PCP: Glover Lenis, MD  Date: 01/27/2024  HPI Logan Harrell is a 58 y.o. male who presents today for Colonoscopy for Constipation, incomplete evacuation .  Patient notes worsening colonoscopy.  He notes incomplete emptying with bowel movements.  He denies abdominal pain melena and hematochezia.  Bowel movements are daily.  He is on Ozempic  which has been held for this procedure (last dose 2 weeks ago)  Last colonoscopy in [facility] 10 years ago reportedly normal.  Mother with a history of colon cancer Hemoglobin 13.6 MCV 75 platelets 287,000 ferritin 63 iron sat 24% INR 1.0   Past Medical History:  Diagnosis Date   Anemia    Arthritis    Asthma    COPD (chronic obstructive pulmonary disease) (HCC) 02/20   Diabetes mellitus without complication (HCC)    Hyperlipidemia    Hypertension    Sleep apnea     History reviewed. No pertinent surgical history.  Family History Mother-CRC No other h/o GI disease or malignancy  Review of Systems  Constitutional:  Negative for activity change, appetite change, chills, diaphoresis, fatigue, fever and unexpected weight change.  HENT:  Negative for trouble swallowing and voice change.   Respiratory:  Negative for shortness of breath and wheezing.   Cardiovascular:  Negative for chest pain, palpitations and leg swelling.  Gastrointestinal:  Positive for constipation. Negative for abdominal distention, abdominal pain, anal bleeding, blood in stool, diarrhea, nausea, rectal pain and vomiting.  Musculoskeletal:  Negative for arthralgias and myalgias.  Skin:  Negative for color change and pallor.  Neurological:  Negative for dizziness, syncope and weakness.  Psychiatric/Behavioral:  Negative for confusion. The patient is not nervous/anxious.   All other systems reviewed and are  negative.    Medications No current facility-administered medications on file prior to encounter.   Current Outpatient Medications on File Prior to Encounter  Medication Sig Dispense Refill   aspirin  EC 81 MG tablet Take 81 mg by mouth daily.     carvedilol  (COREG ) 12.5 MG tablet Take 1 tablet (12.5 mg total) by mouth 2 (two) times daily with a meal. 60 tablet 11   empagliflozin (JARDIANCE) 25 MG TABS tablet Take 25 mg by mouth daily.     lisinopril  (ZESTRIL ) 40 MG tablet Take 1 tablet (40 mg total) by mouth daily. 30 tablet 11   Multiple Vitamin (MULTIVITAMIN) tablet Take 1 tablet by mouth daily.     rosuvastatin  (CRESTOR ) 10 MG tablet Take 10 mg by mouth daily.     spironolactone  (ALDACTONE ) 100 MG tablet Take 1 tablet (100 mg total) by mouth daily. 30 tablet 11   vitamin C (ASCORBIC ACID) 500 MG tablet Take 500 mg by mouth daily.     VITAMIN D, CHOLECALCIFEROL, PO Take 1-2 capsules by mouth daily.     amLODipine  (NORVASC ) 5 MG tablet Take 1 tablet (5 mg total) by mouth every evening. 30 tablet 2    Pertinent medications related to GI and procedure were reviewed by me with the patient prior to the procedure   Current Facility-Administered Medications:    0.9 %  sodium chloride  infusion, , Intravenous, Continuous, Jungling Elspeth Ozell, DO, Last Rate: 20 mL/hr at 01/27/24 0655, 20 mL/hr at 01/27/24 0655  sodium chloride  20 mL/hr (01/27/24 0655)       No Known Allergies Allergies were reviewed by me  prior to the procedure  Objective   Body mass index is 44.7 kg/m. Vitals:   01/27/24 0634  BP: (!) 148/70  Pulse: 78  Resp: 20  Temp: (!) 96.9 F (36.1 C)  TempSrc: Temporal  SpO2: 96%  Weight: 133.4 kg  Height: 5' 8 (1.727 m)     Physical Exam Vitals and nursing note reviewed.  Constitutional:      General: He is not in acute distress.    Appearance: Normal appearance. He is obese. He is not ill-appearing, toxic-appearing or diaphoretic.  HENT:     Head:  Normocephalic and atraumatic.     Nose: Nose normal.     Mouth/Throat:     Mouth: Mucous membranes are moist.     Pharynx: Oropharynx is clear.  Eyes:     General: No scleral icterus.    Extraocular Movements: Extraocular movements intact.  Cardiovascular:     Rate and Rhythm: Normal rate and regular rhythm.     Heart sounds: Normal heart sounds. No murmur heard.    No friction rub. No gallop.  Pulmonary:     Effort: Pulmonary effort is normal. No respiratory distress.     Breath sounds: Normal breath sounds. No wheezing, rhonchi or rales.  Abdominal:     General: Bowel sounds are normal. There is no distension.     Palpations: Abdomen is soft.     Tenderness: There is no abdominal tenderness. There is no guarding or rebound.  Musculoskeletal:     Cervical back: Neck supple.     Right lower leg: No edema.     Left lower leg: No edema.  Skin:    General: Skin is warm and dry.     Coloration: Skin is not jaundiced or pale.  Neurological:     General: No focal deficit present.     Mental Status: He is alert and oriented to person, place, and time. Mental status is at baseline.  Psychiatric:        Mood and Affect: Mood normal.        Behavior: Behavior normal.        Thought Content: Thought content normal.        Judgment: Judgment normal.      Assessment:  Logan Harrell is a 58 y.o. male  who presents today for Colonoscopy for Constipation, incomplete evacuation .  Plan:  Colonoscopy with possible intervention today  Colonoscopy with possible biopsy, control of bleeding, polypectomy, and interventions as necessary has been discussed with the patient/patient representative. Informed consent was obtained from the patient/patient representative after explaining the indication, nature, and risks of the procedure including but not limited to death, bleeding, perforation, missed neoplasm/lesions, cardiorespiratory compromise, and reaction to medications. Opportunity for  questions was given and appropriate answers were provided. Patient/patient representative has verbalized understanding is amenable to undergoing the procedure.   [name], DO  [facility]  Portions of the record may have been created with voice recognition software. Occasional wrong-word or 'sound-a-like' substitutions may have occurred due to the inherent limitations of voice recognition software.  Read the chart carefully and recognize, using context, where substitutions may have occurred.

## 2024-01-27 ENCOUNTER — Ambulatory Visit: Payer: BC Managed Care – PPO | Admitting: General Practice

## 2024-01-27 ENCOUNTER — Encounter
Admission: RE | Disposition: A | Discharge status: Home / Self Care | Payer: Self-pay | Source: Home / Self Care | Attending: Gastroenterology

## 2024-01-27 ENCOUNTER — Encounter: Payer: Self-pay | Admitting: Gastroenterology

## 2024-01-27 ENCOUNTER — Ambulatory Visit
Admission: RE | Admit: 2024-01-27 | Discharge: 2024-01-27 | Disposition: A | Discharge status: Home / Self Care | Payer: BC Managed Care – PPO | Attending: Gastroenterology | Admitting: Gastroenterology

## 2024-01-27 DIAGNOSIS — Z1211 Encounter for screening for malignant neoplasm of colon: Secondary | ICD-10-CM | POA: Diagnosis present

## 2024-01-27 DIAGNOSIS — D125 Benign neoplasm of sigmoid colon: Secondary | ICD-10-CM | POA: Insufficient documentation

## 2024-01-27 DIAGNOSIS — K640 First degree hemorrhoids: Secondary | ICD-10-CM | POA: Diagnosis not present

## 2024-01-27 DIAGNOSIS — Z800 Family history of malignant neoplasm of digestive organs: Secondary | ICD-10-CM | POA: Diagnosis not present

## 2024-01-27 DIAGNOSIS — Z7984 Long term (current) use of oral hypoglycemic drugs: Secondary | ICD-10-CM | POA: Insufficient documentation

## 2024-01-27 DIAGNOSIS — I10 Essential (primary) hypertension: Secondary | ICD-10-CM | POA: Insufficient documentation

## 2024-01-27 DIAGNOSIS — D122 Benign neoplasm of ascending colon: Secondary | ICD-10-CM | POA: Insufficient documentation

## 2024-01-27 DIAGNOSIS — G4730 Sleep apnea, unspecified: Secondary | ICD-10-CM | POA: Insufficient documentation

## 2024-01-27 DIAGNOSIS — Z87891 Personal history of nicotine dependence: Secondary | ICD-10-CM | POA: Insufficient documentation

## 2024-01-27 DIAGNOSIS — K5730 Diverticulosis of large intestine without perforation or abscess without bleeding: Secondary | ICD-10-CM | POA: Insufficient documentation

## 2024-01-27 HISTORY — PX: COLONOSCOPY WITH PROPOFOL: SHX5780

## 2024-01-27 HISTORY — PX: POLYPECTOMY: SHX5525

## 2024-01-27 LAB — GLUCOSE, CAPILLARY: Glucose-Capillary: 143 mg/dL — ABNORMAL HIGH (ref 70–99)

## 2024-01-27 SURGERY — COLONOSCOPY WITH PROPOFOL
Anesthesia: General

## 2024-01-27 MED ORDER — PROPOFOL 10 MG/ML IV BOLUS
INTRAVENOUS | Status: AC
  Filled 2024-01-27: qty 20

## 2024-01-27 MED ORDER — PROPOFOL 10 MG/ML IV BOLUS
INTRAVENOUS | Status: DC | PRN
  Administered 2024-01-27: 100 mg via INTRAVENOUS

## 2024-01-27 MED ORDER — SODIUM CHLORIDE 0.9 % IV SOLN
INTRAVENOUS | Status: DC
  Administered 2024-01-27: 20 mL/h via INTRAVENOUS

## 2024-01-27 MED ORDER — LIDOCAINE HCL (PF) 2 % IJ SOLN
INTRAMUSCULAR | Status: AC
  Filled 2024-01-27: qty 5

## 2024-01-27 MED ORDER — PROPOFOL 500 MG/50ML IV EMUL
INTRAVENOUS | Status: DC | PRN
  Administered 2024-01-27: 120 ug/kg/min via INTRAVENOUS

## 2024-01-27 MED ORDER — LIDOCAINE HCL (CARDIAC) PF 100 MG/5ML IV SOSY
PREFILLED_SYRINGE | INTRAVENOUS | Status: DC | PRN
  Administered 2024-01-27: 50 mg via INTRAVENOUS

## 2024-01-27 MED ORDER — PROPOFOL 1000 MG/100ML IV EMUL
INTRAVENOUS | Status: AC
  Filled 2024-01-27: qty 100

## 2024-01-27 NOTE — Anesthesia Preprocedure Evaluation (Signed)
 Anesthesia Evaluation  Patient identified by MRN, date of birth, ID band Patient awake    Reviewed: Allergy & Precautions, NPO status , Patient's Chart, lab work & pertinent test results  Airway Mallampati: III  TM Distance: >3 FB Neck ROM: full    Dental  (+) Chipped, Dental Advidsory Given   Pulmonary sleep apnea , former smoker   Pulmonary exam normal        Cardiovascular hypertension, negative cardio ROS Normal cardiovascular exam     Neuro/Psych negative neurological ROS  negative psych ROS   GI/Hepatic negative GI ROS, Neg liver ROS,,,  Endo/Other  negative endocrine ROSdiabetes    Renal/GU negative Renal ROS  negative genitourinary   Musculoskeletal   Abdominal   Peds  Hematology negative hematology ROS (+)   Anesthesia Other Findings Past Medical History: No date: Anemia No date: Arthritis No date: Asthma 02/20: COPD (chronic obstructive pulmonary disease) (HCC) No date: Diabetes mellitus without complication (HCC) No date: Hyperlipidemia No date: Hypertension No date: Sleep apnea  History reviewed. No pertinent surgical history.  BMI    Body Mass Index: 44.70 kg/m      Reproductive/Obstetrics negative OB ROS                             Anesthesia Physical Anesthesia Plan  ASA: 3  Anesthesia Plan: General   Post-op Pain Management: Minimal or no pain anticipated   Induction: Intravenous  PONV Risk Score and Plan: 2 and Propofol  infusion, TIVA and Ondansetron   Airway Management Planned: Nasal Cannula  Additional Equipment: None  Intra-op Plan:   Post-operative Plan:   Informed Consent: I have reviewed the patients History and Physical, chart, labs and discussed the procedure including the risks, benefits and alternatives for the proposed anesthesia with the patient or authorized representative who has indicated his/her understanding and acceptance.      Dental advisory given  Plan Discussed with: CRNA and Surgeon  Anesthesia Plan Comments: (Discussed risks of anesthesia with patient, including possibility of difficulty with spontaneous ventilation under anesthesia necessitating airway intervention, PONV, and rare risks such as cardiac or respiratory or neurological events, and allergic reactions. Discussed the role of CRNA in patient's perioperative care. Patient understands.)       Anesthesia Quick Evaluation

## 2024-01-27 NOTE — Op Note (Addendum)
 [facility] Gastroenterology Patient Name: Logan Harrell Procedure Date: 01/27/2024 7:00 AM MRN: [id number] Account #: [id number] Date of Birth: August 03, 1966 Admit Type: Outpatient Age: 58 Room: [facility] ENDO ROOM 1 Gender: Male Note Status: Supervisor Override Instrument Name: [id number] Procedure:             Colonoscopy Indications:           Screening in patient at increased risk: Family history                         of 1st-degree relative with colorectal cancer,                         Constipation Providers:             Elspeth Ozell Onita ROSALEA, DO Referring MD:          Alm HERO. Glover, MD (Referring MD) Medicines:             Monitored Anesthesia Care Complications:         No immediate complications. Estimated blood loss:                         Minimal. Procedure:             Pre-Anesthesia Assessment:                        - Prior to the procedure, a History and Physical was                         performed, and patient medications and allergies were                         reviewed. The patient is competent. The risks and                         benefits of the procedure and the sedation options and                         risks were discussed with the patient. All questions                         were answered and informed consent was obtained.                         Patient identification and proposed procedure were                         verified by the physician, the nurse, the anesthetist                         and the technician in the endoscopy suite. Mental                         Status Examination: alert and oriented. Airway                         Examination: normal oropharyngeal airway and neck  mobility. Respiratory Examination: clear to                         auscultation. CV Examination: RRR, no murmurs, no S3                         or S4. Prophylactic Antibiotics: The patient does not                          require prophylactic antibiotics. Prior                         Anticoagulants: The patient has taken no anticoagulant                         or antiplatelet agents. ASA Grade Assessment: III - A                         patient with severe systemic disease. After reviewing                         the risks and benefits, the patient was deemed in                         satisfactory condition to undergo the procedure. The                         anesthesia plan was to use monitored anesthesia care                         (MAC). Immediately prior to administration of                         medications, the patient was re-assessed for adequacy                         to receive sedatives. The heart rate, respiratory                         rate, oxygen saturations, blood pressure, adequacy of                         pulmonary ventilation, and response to care were                         monitored throughout the procedure. The physical                         status of the patient was re-assessed after the                         procedure.                        After obtaining informed consent, the colonoscope was                         passed under direct vision. Throughout the procedure,  the patient's blood pressure, pulse, and oxygen                         saturations were monitored continuously. The                         Colonoscope was introduced through the anus and                         advanced to the the cecum, identified by appendiceal                         orifice and ileocecal valve. The colonoscopy was                         performed without difficulty. The patient tolerated                         the procedure well. The quality of the bowel                         preparation was evaluated using the BBPS [facility] Bowel                         Preparation Scale) with scores of: Right Colon = 2                         (minor amount of  residual staining, small fragments of                         stool and/or opaque liquid, but mucosa seen well),                         Transverse Colon = 3 (entire mucosa seen well with no                         residual staining, small fragments of stool or opaque                         liquid) and Left Colon = 3 (entire mucosa seen well                         with no residual staining, small fragments of stool or                         opaque liquid). The total BBPS score equals 8. The                         quality of the bowel preparation was excellent. The                         ileocecal valve, appendiceal orifice, and rectum were                         photographed. Findings:      The perianal and digital rectal examinations were normal. Pertinent       negatives include normal  sphincter tone.      Three sessile polyps were found in the sigmoid colon, ascending colon       and cecum. The polyps were 1 to 2 mm in size. These polyps were removed       with a jumbo cold forceps. Resection and retrieval were complete.       Estimated blood loss was minimal.      A few small-mouthed diverticula were found in the sigmoid colon.       Estimated blood loss: none.      Non-bleeding internal hemorrhoids were found during retroflexion. The       hemorrhoids were Grade I (internal hemorrhoids that do not prolapse).       Estimated blood loss: none.      The exam was otherwise without abnormality on direct and retroflexion       views. Impression:            - Three 1 to 2 mm polyps in the sigmoid colon, in the                         ascending colon and in the cecum, removed with a jumbo                         cold forceps. Resected and retrieved.                        - Diverticulosis in the sigmoid colon.                        - Non-bleeding internal hemorrhoids.                        - The examination was otherwise normal on direct and                         retroflexion  views. Recommendation:        - Patient has a contact number available for                         emergencies. The signs and symptoms of potential                         delayed complications were discussed with the patient.                         Return to normal activities tomorrow. Written                         discharge instructions were provided to the patient.                        - Discharge patient to home.                        - Resume previous diet.                        - Continue present medications.                        -  Await pathology results.                        - Repeat colonoscopy for surveillance based on                         pathology results.                        - Return to referring physician as previously                         scheduled.                        - The findings and recommendations were discussed with                         the patient. Procedure Code(s):     --- Professional ---                        [phone], Colonoscopy, flexible; with biopsy, single or                         multiple Diagnosis Code(s):     --- Professional ---                        Z80.0, Family history of malignant neoplasm of                         digestive organs                        D12.5, Benign neoplasm of sigmoid colon                        D12.2, Benign neoplasm of ascending colon                        D12.0, Benign neoplasm of cecum                        K64.0, First degree hemorrhoids                        K57.30, Diverticulosis of large intestine without                         perforation or abscess without bleeding CPT copyright 2022 American Medical Association. All rights reserved. The codes documented in this report are preliminary and upon coder review may  be revised to meet current compliance requirements. Attending Participation:      I personally performed the entire procedure. Elspeth Jungling, DO [name] DO,  DO 01/27/2024 8:02:09 AM This report has been signed electronically. Number of Addenda: 0 Note Initiated On: 01/27/2024 7:00 AM Scope Withdrawal Time: 0 hours 10 minutes 13 seconds  Total Procedure Duration: 0 hours 17 minutes 46 seconds  Estimated Blood Loss:  Estimated blood loss was minimal.      [facility]

## 2024-01-27 NOTE — Transfer of Care (Signed)
 Immediate Anesthesia Transfer of Care Note  Patient: Logan Harrell  Procedure(s) Performed: COLONOSCOPY WITH PROPOFOL  POLYPECTOMY  Patient Location: PACU and Endoscopy Unit  Anesthesia Type:General  Level of Consciousness: drowsy and patient cooperative  Airway & Oxygen Therapy: Patient Spontanous Breathing  Post-op Assessment: Report given to RN and Post -op Vital signs reviewed and stable  Post vital signs: Reviewed and stable  Last Vitals:  Vitals Value Taken Time  BP 91/56 01/27/24 0803  Temp    Pulse 73 01/27/24 0802  Resp 20 01/27/24 0803  SpO2 100 % 01/27/24 0803  Vitals shown include unfiled device data.  Last Pain:  Vitals:   01/27/24 0634  TempSrc: Temporal  PainSc: 0-No pain         Complications: No notable events documented.

## 2024-01-27 NOTE — Anesthesia Postprocedure Evaluation (Signed)
 Anesthesia Post Note  Patient: Logan Harrell  Procedure(s) Performed: COLONOSCOPY WITH PROPOFOL  POLYPECTOMY  Patient location during evaluation: Endoscopy Anesthesia Type: General Level of consciousness: awake and alert Pain management: pain level controlled Vital Signs Assessment: post-procedure vital signs reviewed and stable Respiratory status: spontaneous breathing, nonlabored ventilation, respiratory function stable and patient connected to nasal cannula oxygen Cardiovascular status: blood pressure returned to baseline and stable Postop Assessment: no apparent nausea or vomiting Anesthetic complications: no  No notable events documented.   Last Vitals:  Vitals:   01/27/24 0811 01/27/24 0822  BP: 109/62 138/86  Pulse: 87 80  Resp: (!) 23 18  Temp:    SpO2: 97% 97%    Last Pain:  Vitals:   01/27/24 0822  TempSrc:   PainSc: 0-No pain                 Debby Mines

## 2024-01-27 NOTE — Interval H&P Note (Signed)
 History and Physical Interval Note: Preprocedure H&P from 01/27/24  was reviewed and there was no interval change after seeing and examining the patient.  Written consent was obtained from the patient after discussion of risks, benefits, and alternatives. Patient has consented to proceed with Colonoscopy with possible intervention   01/27/2024 7:32 AM  Logan Harrell  has presented today for surgery, with the diagnosis of K59.00 (ICD-10-CM) - Constipation, unspecified constipation type.  The various methods of treatment have been discussed with the patient and family. After consideration of risks, benefits and other options for treatment, the patient has consented to  Procedure(s) with comments: COLONOSCOPY WITH PROPOFOL  (N/A) - DM as a surgical intervention.  The patient's history has been reviewed, patient examined, no change in status, stable for surgery.  I have reviewed the patient's chart and labs.  Questions were answered to the patient's satisfaction.     [name]

## 2024-01-30 ENCOUNTER — Encounter: Payer: Self-pay | Admitting: Gastroenterology

## 2024-01-30 LAB — SURGICAL PATHOLOGY

## 2024-02-10 ENCOUNTER — Telehealth: Payer: Self-pay | Admitting: Family Medicine

## 2024-02-10 NOTE — Telephone Encounter (Unsigned)
Copied from CRM [id number]. Topic: General - Other >> Feb 10, 2024 11:17 AM Gildardo Pounds wrote: Reason for CRM: Patient had FMLA paperwork filled out in Oct 2024. Do you still have the paperwork on file? Callback number is [phone]

## 2024-02-14 NOTE — Progress Notes (Signed)
 I,Logan Harrell, CMA,acting as a Neurosurgeon for [employer], NP.,have documented all relevant documentation on the behalf of Logan Hose, NP,as directed by  Logan Hose, NP while in the presence of Logan Hose, NP.  Subjective:  Patient ID: Logan Harrell , male    DOB: 19-Jul-1966 , 58 y.o.   MRN: [id number]  Chief Complaint  Patient presents with   paperwork    HPI  Patient is a 58 year old male who presents today with hypertension with CHF. Patient is now followed by Dr Logan Harrell, Cardiology, [facility] , [place] , [place].He states that he  established care with Dr Logan Harrell, [facility] , [place] because it is easier for him to drive from his house in [place] to [place], [place] than to our office in [place], [place]. He wants to pick up his  FMLA paperwork today. Patient has  no complains at this time.       Past Medical History:  Diagnosis Date   Anemia    Arthritis    Asthma    COPD (chronic obstructive pulmonary disease) (HCC) 02/20   Diabetes mellitus without complication (HCC)    Encounter for completion of form with patient 02/20/2024   Hyperlipidemia    Hypertension    Sleep apnea      Family History  Problem Relation Age of Onset   Diabetes Mother    Kidney disease Mother    Obesity Mother    Diabetes Father    Obesity Father    Stroke Father      Current Outpatient Medications:    amLODipine (NORVASC) 5 MG tablet, Take 1 tablet (5 mg total) by mouth every evening., Disp: 30 tablet, Rfl: 2   aspirin EC 81 MG tablet, Take 81 mg by mouth daily., Disp: , Rfl:    carvedilol (COREG) 12.5 MG tablet, Take 1 tablet (12.5 mg total) by mouth 2 (two) times daily with a meal., Disp: 60 tablet, Rfl: 11   empagliflozin (JARDIANCE) 25 MG TABS tablet, Take 25 mg by mouth daily., Disp: , Rfl:    lisinopril (ZESTRIL) 40 MG tablet, Take 1 tablet (40 mg total) by mouth daily., Disp: 30 tablet, Rfl: 11   metFORMIN (GLUCOPHAGE-XR) 500 MG 24 hr tablet, Take 1,000 mg by mouth  daily with breakfast., Disp: , Rfl:    Multiple Vitamin (MULTIVITAMIN) tablet, Take 1 tablet by mouth daily., Disp: , Rfl:    rosuvastatin (CRESTOR) 10 MG tablet, Take 10 mg by mouth daily., Disp: , Rfl:    Semaglutide, 1 MG/DOSE, (OZEMPIC, 1 MG/DOSE,) 4 MG/3ML SOPN, INJECT 1 MG INTO THE SKIN EVERY 7 (SEVEN) DAYS. (Patient taking differently: Inject 2 mg into the skin every 7 (seven) days.), Disp: 9 mL, Rfl: 2   spironolactone (ALDACTONE) 100 MG tablet, Take 1 tablet (100 mg total) by mouth daily., Disp: 30 tablet, Rfl: 11   vitamin C (ASCORBIC ACID) 500 MG tablet, Take 500 mg by mouth daily., Disp: , Rfl:    VITAMIN D, CHOLECALCIFEROL, PO, Take 1-2 capsules by mouth daily., Disp: , Rfl:    No Known Allergies   Review of Systems  Constitutional: Negative.   HENT: Negative.    Eyes: Negative.   Respiratory: Negative.    Cardiovascular:  Negative for chest pain, palpitations and leg swelling.  Gastrointestinal: Negative.   Skin: Negative.   Neurological: Negative.   Psychiatric/Behavioral: Negative.       Today's Vitals   02/15/24 1503  BP: 130/78  Pulse: 93  Temp: 98.2 F (36.8 C)  TempSrc: Oral  Weight: (!) 313 lb (142 kg)  Height: 5\' 8"  (1.727 m)  PainSc: 0-No pain   Body mass index is 47.59 kg/m.  Wt Readings from Last 3 Encounters:  02/15/24 (!) 313 lb (142 kg)  01/27/24 294 lb (133.4 kg)  12/12/23 (!) 304 lb 6.4 oz (138.1 kg)    The ASCVD Risk score (Arnett DK, et al., 2019) failed to calculate for the following reasons:   Cannot find a previous HDL lab   Cannot find a previous total cholesterol lab  Objective:  Physical Exam HENT:     Head: Normocephalic.  Cardiovascular:     Rate and Rhythm: Normal rate and regular rhythm.  Pulmonary:     Effort: Pulmonary effort is normal.  Neurological:     Mental Status: He is alert.         Assessment And Plan:  Hypertensive cardiomyopathy, with heart failure [facility]) Assessment & Plan: Followed by cardiology;  continue current treatment.   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   Class 3 severe obesity due to excess calories with serious comorbidity and body mass index (BMI) of 40.0 to 44.9 in adult [facility]) Assessment & Plan: He is encouraged to strive for BMI less than 30 to decrease cardiac risk. Advised to aim for at least 150 minutes of exercise per week.    Encounter for completion of form with patient Assessment & Plan: FMLA paper completed     Return if symptoms worsen or fail to improve.  Patient was given opportunity to ask questions. Patient verbalized understanding of the plan and was able to repeat key elements of the plan. All questions were answered to their satisfaction.    I, Logan Hose, NP, have reviewed all documentation for this visit. The documentation on 02/20/2024 for the exam, diagnosis, procedures, and orders are all accurate and complete.   IF YOU HAVE BEEN REFERRED TO A SPECIALIST, IT MAY TAKE 1-2 WEEKS TO SCHEDULE/PROCESS THE REFERRAL. IF YOU HAVE NOT HEARD FROM US/SPECIALIST IN TWO WEEKS, PLEASE GIVE [place] A CALL AT [phone].

## 2024-02-15 ENCOUNTER — Encounter: Payer: Self-pay | Admitting: Family Medicine

## 2024-02-15 ENCOUNTER — Ambulatory Visit: Payer: BC Managed Care – PPO | Admitting: Family Medicine

## 2024-02-15 VITALS — BP 130/78 | HR 93 | Temp 98.2°F | Ht 68.0 in | Wt 313.0 lb

## 2024-02-15 DIAGNOSIS — I110 Hypertensive heart disease with heart failure: Secondary | ICD-10-CM | POA: Diagnosis not present

## 2024-02-15 DIAGNOSIS — E0859 Diabetes mellitus due to underlying condition with other circulatory complications: Secondary | ICD-10-CM

## 2024-02-15 DIAGNOSIS — I43 Cardiomyopathy in diseases classified elsewhere: Secondary | ICD-10-CM

## 2024-02-15 DIAGNOSIS — Z0289 Encounter for other administrative examinations: Secondary | ICD-10-CM

## 2024-02-15 DIAGNOSIS — E66813 Obesity, class 3: Secondary | ICD-10-CM | POA: Diagnosis not present

## 2024-02-15 DIAGNOSIS — Z6841 Body Mass Index (BMI) 40.0 and over, adult: Secondary | ICD-10-CM

## 2024-02-20 ENCOUNTER — Encounter: Payer: Self-pay | Admitting: Family Medicine

## 2024-02-20 DIAGNOSIS — Z0289 Encounter for other administrative examinations: Secondary | ICD-10-CM

## 2024-02-20 HISTORY — DX: Encounter for other administrative examinations: Z02.89

## 2024-02-20 NOTE — Assessment & Plan Note (Addendum)
 Continue current dose ozempic inj 1 mg SQ once weekly and metformin 1000 mg XR by mouth as prescribed by provider.

## 2024-02-20 NOTE — Assessment & Plan Note (Signed)
 He is encouraged to strive for BMI less than 30 to decrease cardiac risk. Advised to aim for at least 150 minutes of exercise per week.

## 2024-02-20 NOTE — Assessment & Plan Note (Signed)
 FMLA paper completed

## 2024-02-20 NOTE — Assessment & Plan Note (Signed)
 Followed by cardiology; continue current treatment.

## 2024-02-23 ENCOUNTER — Telehealth: Payer: Self-pay

## 2024-02-23 NOTE — Telephone Encounter (Signed)
 I called and left patient a voicemail. We received more FMLA paperwork and I wanted to know what it is for. YL,RMA

## 2024-08-06 ENCOUNTER — Other Ambulatory Visit: Payer: Self-pay

## 2024-08-06 ENCOUNTER — Emergency Department
Admission: EM | Admit: 2024-08-06 | Discharge: 2024-08-06 | Disposition: A | Discharge status: Home / Self Care | Attending: Emergency Medicine | Admitting: Emergency Medicine

## 2024-08-06 ENCOUNTER — Emergency Department

## 2024-08-06 DIAGNOSIS — E119 Type 2 diabetes mellitus without complications: Secondary | ICD-10-CM | POA: Diagnosis not present

## 2024-08-06 DIAGNOSIS — J441 Chronic obstructive pulmonary disease with (acute) exacerbation: Secondary | ICD-10-CM | POA: Insufficient documentation

## 2024-08-06 DIAGNOSIS — J069 Acute upper respiratory infection, unspecified: Secondary | ICD-10-CM | POA: Insufficient documentation

## 2024-08-06 DIAGNOSIS — I509 Heart failure, unspecified: Secondary | ICD-10-CM | POA: Diagnosis not present

## 2024-08-06 DIAGNOSIS — D72829 Elevated white blood cell count, unspecified: Secondary | ICD-10-CM | POA: Insufficient documentation

## 2024-08-06 DIAGNOSIS — R059 Cough, unspecified: Secondary | ICD-10-CM | POA: Diagnosis present

## 2024-08-06 DIAGNOSIS — I110 Hypertensive heart disease with heart failure: Secondary | ICD-10-CM | POA: Insufficient documentation

## 2024-08-06 LAB — CBC
HCT: 44.8 % (ref 39.0–52.0)
Hemoglobin: 14.1 g/dL (ref 13.0–17.0)
MCH: 22.3 pg — ABNORMAL LOW (ref 26.0–34.0)
MCHC: 31.5 g/dL (ref 30.0–36.0)
MCV: 70.8 fL — ABNORMAL LOW (ref 80.0–100.0)
Platelets: 350 K/uL (ref 150–400)
RBC: 6.33 MIL/uL — ABNORMAL HIGH (ref 4.22–5.81)
RDW: 14 % (ref 11.5–15.5)
WBC: 10.7 K/uL — ABNORMAL HIGH (ref 4.0–10.5)
nRBC: 0 % (ref 0.0–0.2)

## 2024-08-06 LAB — BASIC METABOLIC PANEL WITH GFR
Anion gap: 12 (ref 5–15)
BUN: 15 mg/dL (ref 6–20)
CO2: 22 mmol/L (ref 22–32)
Calcium: 9.2 mg/dL (ref 8.9–10.3)
Chloride: 102 mmol/L (ref 98–111)
Creatinine, Ser: 0.95 mg/dL (ref 0.61–1.24)
GFR, Estimated: >60 mL/min (ref >60)
Glucose, Bld: 231 mg/dL — ABNORMAL HIGH (ref 70–99)
Potassium: 5.1 mmol/L (ref 3.5–5.1)
Sodium: 136 mmol/L (ref 135–145)

## 2024-08-06 LAB — RESP PANEL BY RT-PCR (RSV, FLU A&B, COVID)  RVPGX2
Influenza A by PCR: NEGATIVE
Influenza B by PCR: NEGATIVE
Resp Syncytial Virus by PCR: NEGATIVE
SARS Coronavirus 2 by RT PCR: NEGATIVE

## 2024-08-06 LAB — BRAIN NATRIURETIC PEPTIDE: B Natriuretic Peptide: 73.8 pg/mL (ref 0.0–100.0)

## 2024-08-06 LAB — TROPONIN I (HIGH SENSITIVITY): Troponin I (High Sensitivity): 16 ng/L (ref <18)

## 2024-08-06 MED ORDER — PREDNISONE 20 MG PO TABS
50.0000 mg | ORAL_TABLET | Freq: Once | ORAL | Status: AC
  Administered 2024-08-06: 50 mg via ORAL
  Filled 2024-08-06: qty 3

## 2024-08-06 MED ORDER — ALBUTEROL SULFATE HFA 108 (90 BASE) MCG/ACT IN AERS: 2.0000 | INHALATION_SPRAY | Freq: Four times a day (QID) | RESPIRATORY_TRACT | 2 refills | Status: AC | PRN

## 2024-08-06 MED ORDER — IPRATROPIUM-ALBUTEROL 0.5-2.5 (3) MG/3ML IN SOLN
3.0000 mL | Freq: Once | RESPIRATORY_TRACT | Status: AC
  Administered 2024-08-06: 3 mL via RESPIRATORY_TRACT
  Filled 2024-08-06: qty 3

## 2024-08-06 MED ORDER — PREDNISONE 50 MG PO TABS: ORAL_TABLET | ORAL | 0 refills | Status: AC

## 2024-08-06 NOTE — Progress Notes (Signed)
Pt presents with a cough x 3 days.  

## 2024-08-06 NOTE — ED Provider Notes (Signed)
 [facility] Provider Note    Event Date/Time   First MD Initiated Contact with Patient 08/06/24 [phone]     (approximate)   History   Cough   HPI  Logan Harrell is a 58 y.o. male with a past medical history of obesity, hypertension, diabetes, hypertensive cardiomyopathy with heart failure who presents today for evaluation of cough.  Patient reports that this has been ongoing for the past couple of days.  His daughter was recently sick with COVID-19.  He reports that he has a dry cough and has coughing fits.  No chest pain or shortness of breath.  He went to urgent care and they advised that he come to the emergency department for evaluation.  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   Obesity (BMI 30-39.9) 08/11/2023   Bilateral leg edema 08/08/2023   History of type 2 diabetes mellitus 08/08/2023   History of hypertension 08/08/2023   Elevated LFTs 08/08/2023   Cholelithiasis without obstruction 08/08/2023   Hypokalemia 08/08/2023   Elevated troponin 08/08/2023          Physical Exam   Triage Vital Signs: ED Triage Vitals [08/06/24 0928]  Encounter Vitals Group     BP 131/74     Girls Systolic BP Percentile      Girls Diastolic BP Percentile      Boys Systolic BP Percentile      Boys Diastolic BP Percentile      Pulse Rate 88     Resp 18     Temp 98.4 F (36.9 C)     Temp Source Oral     SpO2 98 %     Weight      Height      Head Circumference      Peak Flow      Pain Score 0      Pain Loc      Pain Education      Exclude from Growth Chart     Most recent vital signs: Vitals:   08/06/24 0928 08/06/24 0956  BP: 131/74 (!) 133/99  Pulse: 88 83  Resp: 18 (!) 21  Temp: 98.4 F (36.9 C)   SpO2: 98%     Physical Exam Vitals and nursing note reviewed.  Constitutional:      General: Awake and alert. No acute distress.    Appearance: Normal appearance. The patient is normal weight.  HENT:     Head: Normocephalic and atraumatic.     Mouth: Mucous membranes are moist.  Eyes:     General: PERRL. Normal EOMs        Right eye: No discharge.        Left eye: No discharge.     Conjunctiva/sclera: Conjunctivae normal.  Cardiovascular:     Rate and Rhythm: Normal rate and regular rhythm.     Pulses: Normal pulses.  No JVD Pulmonary:     Effort: Pulmonary effort is normal. No respiratory distress.  Dry cough on exam.  Able to speak easily in complete sentences    Breath  sounds: Normal breath sounds.  Abdominal:     Abdomen is soft. There is no abdominal tenderness. No rebound or guarding. No distention. Musculoskeletal:        General: No swelling. Normal range of motion.     Cervical back: Normal range of motion and neck supple.  No lower extremity edema or calf tenderness Skin:    General: Skin is warm and dry.     Capillary Refill: Capillary refill takes less than 2 seconds.     Findings: No rash.  Neurological:     Mental Status: The patient is awake and alert.      ED Results / Procedures / Treatments   Labs (all labs ordered are listed, but only abnormal results are displayed) Labs Reviewed  BASIC METABOLIC PANEL WITH GFR - Abnormal; Notable for the following components:      Result Value   Glucose, Bld 231 (*)    All other components within normal limits  CBC - Abnormal; Notable for the following components:   WBC 10.7 (*)    RBC 6.33 (*)    MCV 70.8 (*)    MCH 22.3 (*)    All other components within normal limits  RESP PANEL BY RT-PCR  (RSV, FLU A&B, COVID)  [id number]  BRAIN NATRIURETIC PEPTIDE  TROPONIN I (HIGH SENSITIVITY)     EKG     RADIOLOGY I independently reviewed and interpreted imaging and agree with radiologists findings.     PROCEDURES:  Critical Care performed:   Procedures   MEDICATIONS ORDERED IN ED: Medications  ipratropium-albuterol  (DUONEB) 0.5-2.5 (3) MG/3ML nebulizer solution 3 mL (3 mLs Nebulization Given 08/06/24 1052)  predniSONE  (DELTASONE ) tablet 50 mg (50 mg Oral Given 08/06/24 1051)     IMPRESSION / MDM / ASSESSMENT AND PLAN / ED COURSE  I reviewed the triage vital signs and the nursing notes.   Differential diagnosis includes, but is not limited to, bronchitis, URI, COPD exacerbation, pneumonia.  I reviewed the patient's chart.  Patient was seen today at urgent care and was sent to the emergency department for further evaluation.  EKG obtained upon arrival which I personally interpreted which reveals T wave inversions in 2, 3, and aVF.  This was also seen on his EKG obtained on 08/08/2023.  EKG also reviewed by attending MD.  Labs obtained are overall reassuring, no leukocytosis, negative troponin, normal BNP.  No indication for repeat troponin given duration of symptoms.  No pleurisy, clinical signs or symptoms of DVT, hemoptysis, or shortness of breath to suggest pulmonary embolism.  No hypoxia on exam.  Wells score 0 for PE.  Chest x-ray does not reveal evidence of cardiopulmonary abnormality.  He was treated with a DuoNeb and prednisone  with significant improvement of his symptoms.  He was given a prescription for 4 more days of prednisone  to take at home, as well as an inhaler to help with versus bronchospasm.  COVID/flu/RSV swab obtained is negative.  I suspect mild COPD exacerbation in the setting of viral URI.  We discussed return precautions and outpatient follow-up.  Patient understands and agrees with plan.  Discharged in stable condition.  Patient's presentation is most  consistent with acute presentation with potential threat to life or bodily function.  Clinical Course as of 08/06/24 1446  Mon Aug 06, 2024  1127 Patient reports feeling significantly improved. [JP]    Clinical Course User Index [JP] Chalon Zobrist E, PA-C     FINAL CLINICAL IMPRESSION(S) / ED DIAGNOSES   Final  diagnoses:  Viral URI with cough  COPD exacerbation (HCC)     Rx / DC Orders   ED Discharge Orders          Ordered    predniSONE  (DELTASONE ) 50 MG tablet        08/06/24 1129    albuterol  (VENTOLIN  HFA) 108 (90 Base) MCG/ACT inhaler  Every 6 hours PRN        08/06/24 1129             Note:  This document was prepared using Dragon voice recognition software and may include unintentional dictation errors.   Tane Biegler E, PA-C 08/06/24 1446    Arlander Charleston, MD 08/06/24 [phone]

## 2024-08-06 NOTE — Progress Notes (Signed)
 Subjective Patient ID: Logan Harrell is a 58 y.o. male.    58 year old male with a past medical history of hypertension, obstructive sleep apnea, diabetes, and hypertensive cardiomyopathy with a heart failure component presents the clinic complaining of a cough and something in his throat for the past 3 days.  Patient reports coughing continuously for the past 3 days.  He woke up this morning and reports that his throat felt better but he does continue to cough.  He reports needing to use several pillows to prop himself up at night due to coughing and some mild shortness of breath.  He otherwise has no other complaints on today's visit.   History provided by:  Patient   Review of Systems  Constitutional:  Positive for diaphoresis and fatigue. Negative for chills and fever.  HENT:  Negative for congestion, ear discharge, ear pain, rhinorrhea and sore throat.   Respiratory:  Positive for cough and shortness of breath. Negative for chest tightness and wheezing.   Cardiovascular:  Positive for leg swelling. Negative for chest pain and palpitations.  Gastrointestinal:  Negative for abdominal pain, diarrhea, nausea and vomiting.  Musculoskeletal:  Negative for arthralgias and myalgias.  Neurological:  Negative for dizziness, syncope, speech difficulty, weakness, light-headedness and headaches.    Patient History  Allergies: No Known Allergies  Past Medical History:  Diagnosis Date  . Essential hypertension 2012  . History of diabetes mellitus, type II   . Hyperlipidemia    History reviewed. No pertinent surgical history. Social History   Socioeconomic History  . Marital status: Married    Spouse name: CYPHER PAULE  . Number of children: 2  . Years of education: 30  . Highest education level: 12th grade  Occupational History  . Occupation: production  Tobacco Use  . Smoking status: Former    Current packs/day: 1.00    Average packs/day: 1 pack/day for 30.0 years (30.0 ttl  pk-yrs)    Types: Cigarettes  . Smokeless tobacco: Never  Vaping Use  . Vaping status: Never Used  Substance and Sexual Activity  . Alcohol use: Not Currently    Alcohol/week: 1.0 standard drink of alcohol    Types: 1 Cans of beer per week    Comment: Rarely  . Drug use: Never  . Sexual activity: Yes    Partners: Female  Other Topics Concern  . Not on file  Social History Narrative  . Not on file   Family History  Problem Relation Name Age of Onset  . Hypertension Mother    . Diabetes Mother    . Hypertension Father    . Diabetes Father    . Hypertension Brother     Current Outpatient Medications on File Prior to Visit  Medication Sig Dispense Refill  . aspirin  81 MG EC tablet Take 81 mg by mouth 1 (one) time each day.    . lisinopril  20 MG tablet Take 20 mg by mouth 1 (one) time each day.    . rosuvastatin  (Crestor ) 10 MG tablet Take 10 mg by mouth 1 (one) time each day.    SABRA sAXagliptin-metFORMIN ER (Kombiglyze XR) 2.04-999 MG tablet sustained-release 24 hour Take by mouth 2 (two) times a day.    . Semaglutide  (OZEMPIC , 0.25 OR 0.5 MG/DOSE, [place]) Inject under the skin every 7 (seven) days.     No current facility-administered medications on file prior to visit.    Objective  Vitals:   08/06/24 0812  BP: 131/86  Pulse: 97  Resp:  18  Temp: (!) 36.3 C (97.3 F)  SpO2: 98%  Weight: 132 kg  Height: 5' 8  PainSc: 0-No pain     Physical Exam  GENERAL APPEARANCE: afebrile, diaphoretic with continuous coughing but in NAD. Patient is speaking in full sentences with no increased work of breathing. EYES: EOM intact, PERRLA, with no conjunctival injection noted. NECK: Soft, supple with no cervical adenopathy noted. HEART: RRR, S1 and S2 present with no obvious murmur noted. No rubs, gallops or clicks noted. LUNGS: CTA bilaterally with no wheezing, ronchi, rales or crackles noted.  SKIN: 1+ edema noted tot he bilateral lower extremities.  NEURO: Alert and oriented x 3.  CN 2-12 intact bilaterally. No motor or sensory deficits noted to the bilateral upper and lower extremities. Finger to nose and heal to shin normal bilaterally.   Results for orders placed or performed in visit on 08/06/24  POCT RAPID INFLUENZA MCKESSON  Component Result   Rapid Influenza A Ag Negative   Rapid Influenza B Ag Negative   Internal Quality Control Pass  [facility] Covid 19 Antigen POCT  Component Result   Rapid Covid Negative   Internal Quality Control Pass  ECG 12 lead   Narrative   ECG Interpretation: Sinus Rhythm Probable septal infarct  Compared with prior: No, None Available.   Summary of Clinical Condition:  Cough, oprthopnea for 2 days.  Interpretation by Fairy Jenny, PA       Procedures MDM:     1+ Acute illness or injury that poses a threat to life or bodily function     Explanation of Medical Decision Making and variances from expected care:    Advised the patient that he needs higher level of care where on-demand labs and advanced imaging may be utilized to formulate an accurate diagnosis and treatment plan.  Patient's EKG is reviewed with him and he would like to drive himself to the hospital for evaluation.  I advised him that he needed ambulance transfer due to the seriousness of his condition but he continued to decline.  AMA paperwork was completed and discharge paperwork is provided to the patient.  I called [place] emergency department and report was provided.  Patient will arrive to the emergency department via POV.    Unique ordered tests: Two     Assessment requiring historian other than patient: No     Independent visualization of image, tracing, or test: No     Discussion of management with another provider: No     Risk:: High         Assessment/Plan Diagnoses and all orders for this visit:  Orthopnea -     ECG 12 lead  Acute cough -     POCT RAPID INFLUENZA MCKESSON -     [facility] Covid 19 Antigen POCT -     ECG 12  lead     Disposition Status: Emergency Department  Patient Instructions  Advised the patient that he  needs a higher level of care where on demand labs and advanced imaging may be used to form a definitive diagnosis and aid in formulating an appropriate treatment plan.   Progress note signed by Fairy Jenny, PA on 08/06/24 at  9:17 AM

## 2024-08-06 NOTE — ED Triage Notes (Addendum)
 Pt to ED via POV from [facility]. Pt reports cough since Friday. Pt seen at [facility] and had EKG and respiratory swabs done which were normal. Pt sent for imaging. Pt reports intermittent SOB. Pt denies CP. Hx of COPD.

## 2024-08-06 NOTE — Discharge Instructions (Signed)
 Please take the medications as prescribed.  Please return for any new, worsening, or change in symptoms or other concerns.  It was a pleasure caring for you today.

## 2024-08-06 NOTE — ED Notes (Signed)
 Lt green tube collected. Purple needs to be redrawn. Lab called.

## 2024-08-17 NOTE — Progress Notes (Signed)
 Bosten Newstrom is a 58 y.o. male seen in follow up. He was previously seen by Donny Corin, MD and last appt was on 01/09/2024. Today we discussed:  Type 2 diabetes, uncontrolled. His Hb A1c was 12.1% on 07/09/2024. He brings in his glucometer. Sugars are in the 270 - 340 mg/dl range. Currently takes for diabetes: metformin, Jardiance and Ozempic . He is not compliant with these medications. Estimates he misses his pills 2x per week and misses Ozempic  1x per month. Discussed his diet. He tends to eat candy bars most days including today. Will eat sandwich for lunch, popcorn for snack. Eating out at restaurants often for dinner. Denies N/V/abd pain. He has class 3 obesity. No record of a recent dilated eye exam. He did take a course of steroids (Prednisone ) for acute bronchitis up until 08/10/24.  Hypertension, controlled. Now taking lisinopril , amlodipine , and spironolactone  for hypertension.   Adrenal nodule. CT scan of chest/abdomen/pelvis was performed at [facility] on 08/10/23. It showed a 1.9 x 1.7 cm left adrenal nodule with HU 82. The right adrenal was normal. There was also mild mediastinal and hilar adenopathy and several small subpleural pulmonary nodules. Repeat CT scan of abdomen (non-con) on 10/22/23 at [facility] showed no adrenal lesion.  His renin-aldosterone levels were in normal range on 02/20/2024.    Past Medical History:  Diagnosis Date  . Anxiety   . Arthritis   . Breast lump   . COPD (chronic obstructive pulmonary disease) (CMS/HHS-HCC)   . Depression   . Diabetes mellitus without complication (CMS/HHS-HCC)   . Gout   . Headache   . Hyperlipidemia   . Sleep apnea   . Urinary tract infection      Outpatient Medications Marked as Taking for the 08/17/24 encounter (Office Visit) with Solum, Anna Melissa, MD  Medication Sig Dispense Refill  . albuterol  MDI, PROVENTIL , VENTOLIN , PROAIR , HFA 90 mcg/actuation inhaler Inhale 2 inhalations into the lungs every 6 (six) hours as needed for  Wheezing    . amLODIPine  (NORVASC ) 5 MG tablet TAKE 1 TABLET ONCE DAILY 90 tablet 1  . ascorbic acid, vitamin C, (VITAMIN C) 500 MG tablet Take 500 mg by mouth once daily    . aspirin  81 MG EC tablet Take 81 mg by mouth once daily    . carvediloL  (COREG ) 12.5 MG tablet TAKE 1 TABLET TWICE DAILY  WITH MEALS 180 tablet 0  . cholecalciferol 1000 unit tablet Take 1,000 Units by mouth once daily    . hydrocortisone 2.5 % cream Apply topically 2 (two) times daily 30 g 0  . JARDIANCE 25 mg tablet TAKE 1 TABLET ONCE DAILY 90 tablet 0  . lisinopriL  (ZESTRIL ) 40 MG tablet TAKE 1 TABLET ONCE DAILY 90 tablet 1  . metFORMIN (GLUCOPHAGE-XR) 500 MG XR tablet TAKE 2 TABLETS DAILY WITH  DINNER 180 tablet 1  . multivitamin tablet Take 1 tablet by mouth once daily    . rosuvastatin  (CRESTOR ) 10 MG tablet TAKE 1 TABLET ONCE DAILY 90 tablet 1  . semaglutide  (OZEMPIC ) 2 mg/dose (8 mg/3 mL) pen injector INJECT 0.75 MLS (2 MG TOTAL) SUBCUTANEOUSLY ONCE A WEEK 9 mL 1  . spironolactone  (ALDACTONE ) 100 MG tablet TAKE 1 TABLET ONCE DAILY 90 tablet 1      Physical Exam BP 102/60   Pulse 92   Ht 170.2 cm (5' 7)   Wt (!) 131.1 kg (289 lb)   SpO2 95%   BMI 45.26 kg/m   GEN: well developed, well nourished male,  in NAD. SKIN: A bilateral bare foot exam shows no ulcerations or calluses.   Labs  Lab Results  Component Value Date   WBC 7.9 01/09/2024   HGB 13.8 (L) 01/09/2024   HCT 43.8 01/09/2024   PLT 273 01/09/2024   TRIG 178 07/09/2024   HDL 31.2 07/09/2024   ALT 54 01/09/2024   AST 34 01/09/2024   NA 132 (L) 07/09/2024   K 4.7 07/09/2024   CL 100 07/09/2024   CREATININE 1.4 (H) 07/09/2024   BUN 14 07/09/2024   CO2 24.7 07/09/2024   INR 1.0 (L) 11/02/2023   HGBA1C 12.1 (H) 07/09/2024    Last Lipids: Lab Results  Component Value Date   CHOLTOTAL 121 07/09/2024   LDLCALC 54 07/09/2024   HDL 31.2 07/09/2024   TRIG 178 07/09/2024     Assessment 1. Poorly controlled type 2 diabetes mellitus  (CMS/HHS-HCC)   2. Type 2 diabetes mellitus associated with morbid obesity (CMS/HHS-HCC)   3. BMI 45.0-49.9, adult (CMS/HHS-HCC)   4. Obesity, Class III, BMI 40-49.9 (morbid obesity)   5. Hypertension, essential     Plan *We discussed diabetes, its complications, and goals of care. Patient was advised of the importance of diet and behavioral changes to improve glycemic control. Patient was advised of the importance of a low carbohydrate diet. Advised him to stop all concentrated sweets. Patient was advised of the importance of achieving and maintaining a healthy body weight.  *Patient was advised of the importance of regular monitoring of blood sugars. He was advised to monitor sugars daily and to bring the glucometer to future clinic appointments.  *Counseled about the need for compliance with medications.  *Avoid glucocorticoids if possible as this will worsen hyperglycemia.  *Continue metformin, Jardiance, and Ozempic  as prescribed.  *Add Lantus insulin  - 18 units daily.  After 3 days if your morning sugar is still 150 or higher, then adjust the Lantus dose to 24 units nightly.  After 6 days if your morning sugar is still over 150, then adjust the Lantus to 30 units nightly *Continue ACE-I.  *Continue statin to maintain LDL chol <100 mg/dl.  *Will need to re-evaluate for adrenal nodule and if present, eval for hyperfunctioning nodule in future. This was not discussed today due to minimal time. *He is past due for annual eye exam and should have this scheduled.  *Anticipate follow up in 6 weeks or sooner if needed.

## 2024-08-26 ENCOUNTER — Emergency Department
Admission: EM | Admit: 2024-08-26 | Discharge: 2024-08-26 | Disposition: A | Discharge status: Home / Self Care | Attending: Emergency Medicine | Admitting: Emergency Medicine

## 2024-08-26 ENCOUNTER — Emergency Department

## 2024-08-26 ENCOUNTER — Other Ambulatory Visit: Payer: Self-pay

## 2024-08-26 DIAGNOSIS — J449 Chronic obstructive pulmonary disease, unspecified: Secondary | ICD-10-CM | POA: Diagnosis not present

## 2024-08-26 DIAGNOSIS — R55 Syncope and collapse: Secondary | ICD-10-CM | POA: Diagnosis present

## 2024-08-26 DIAGNOSIS — R059 Cough, unspecified: Secondary | ICD-10-CM | POA: Diagnosis not present

## 2024-08-26 DIAGNOSIS — E119 Type 2 diabetes mellitus without complications: Secondary | ICD-10-CM | POA: Diagnosis not present

## 2024-08-26 LAB — COMPREHENSIVE METABOLIC PANEL WITH GFR
ALT: 78 U/L — ABNORMAL HIGH (ref 0–44)
AST: 99 U/L — ABNORMAL HIGH (ref 15–41)
Albumin: 3.5 g/dL (ref 3.5–5.0)
Alkaline Phosphatase: 59 U/L (ref 38–126)
Anion gap: 10 (ref 5–15)
BUN: 19 mg/dL (ref 6–20)
CO2: 23 mmol/L (ref 22–32)
Calcium: 9 mg/dL (ref 8.9–10.3)
Chloride: 101 mmol/L (ref 98–111)
Creatinine, Ser: 1.1 mg/dL (ref 0.61–1.24)
GFR, Estimated: >60 mL/min (ref >60)
Glucose, Bld: 373 mg/dL — ABNORMAL HIGH (ref 70–99)
Potassium: 4.8 mmol/L (ref 3.5–5.1)
Sodium: 134 mmol/L — ABNORMAL LOW (ref 135–145)
Total Bilirubin: 1 mg/dL (ref 0.0–1.2)
Total Protein: 7.1 g/dL (ref 6.5–8.1)

## 2024-08-26 LAB — URINALYSIS, ROUTINE W REFLEX MICROSCOPIC
Bacteria, UA: NONE SEEN
Bilirubin Urine: NEGATIVE
Glucose, UA: >=500 mg/dL — AB
Hgb urine dipstick: NEGATIVE
Ketones, ur: NEGATIVE mg/dL
Leukocytes,Ua: NEGATIVE
Nitrite: NEGATIVE
Protein, ur: NEGATIVE mg/dL
RBC / HPF: 0 RBC/hpf (ref 0–5)
Specific Gravity, Urine: 1.023 (ref 1.005–1.030)
WBC, UA: 0 WBC/hpf (ref 0–5)
pH: 5 (ref 5.0–8.0)

## 2024-08-26 LAB — RESP PANEL BY RT-PCR (RSV, FLU A&B, COVID)  RVPGX2
Influenza A by PCR: NEGATIVE
Influenza B by PCR: NEGATIVE
Resp Syncytial Virus by PCR: NEGATIVE
SARS Coronavirus 2 by RT PCR: NEGATIVE

## 2024-08-26 LAB — CBG MONITORING, ED
Glucose-Capillary: 370 mg/dL — ABNORMAL HIGH (ref 70–99)
Glucose-Capillary: 307 mg/dL — ABNORMAL HIGH (ref 70–99)

## 2024-08-26 LAB — CBC
HCT: 41.3 % (ref 39.0–52.0)
Hemoglobin: 13.4 g/dL (ref 13.0–17.0)
MCH: 22.9 pg — ABNORMAL LOW (ref 26.0–34.0)
MCHC: 32.4 g/dL (ref 30.0–36.0)
MCV: 70.5 fL — ABNORMAL LOW (ref 80.0–100.0)
Platelets: 266 K/uL (ref 150–400)
RBC: 5.86 MIL/uL — ABNORMAL HIGH (ref 4.22–5.81)
RDW: 14.2 % (ref 11.5–15.5)
WBC: 6.5 K/uL (ref 4.0–10.5)
nRBC: 0 % (ref 0.0–0.2)

## 2024-08-26 LAB — TROPONIN I (HIGH SENSITIVITY)
Troponin I (High Sensitivity): 11 ng/L (ref <18)
Troponin I (High Sensitivity): 10 ng/L (ref <18)

## 2024-08-26 MED ORDER — SODIUM CHLORIDE 0.9 % IV BOLUS
1000.0000 mL | Freq: Once | INTRAVENOUS | Status: AC
  Administered 2024-08-26: 1000 mL via INTRAVENOUS

## 2024-08-26 MED ORDER — INSULIN ASPART 100 UNIT/ML IJ SOLN
7.0000 [IU] | Freq: Once | INTRAMUSCULAR | Status: AC
  Administered 2024-08-26: 7 [IU] via SUBCUTANEOUS
  Filled 2024-08-26: qty 7

## 2024-08-26 NOTE — Discharge Instructions (Signed)
 Provider.  Make sure to stay on top of your glucose control.  Return to the ER immediately for new, worsening, or persistent episodes of passing out, dizziness or lightheadedness, high blood glucose, weakness, fever, vomiting, cough, shortness of breath, chest pain, or any other new or worsening symptoms that concern you.

## 2024-08-26 NOTE — ED Provider Notes (Signed)
 [facility] Provider Note    Event Date/Time   First MD Initiated Contact with Patient 08/26/24 1123     (approximate)   History   Loss of Consciousness   HPI  Toluwani Yadav is a 58 y.o. male with a history of COPD, diabetes, hyperlipidemia, gout, arthritis, and depression who presents with a syncopal episode.  The patient states that he has been having a hacking cough for the last 3 weeks.  He started to have an episode of the coughing and went outside to cough up some phlegm, when he suddenly passed out.  He states that he hit his left shoulder.  He did not hit his head.  He denies any recent prior history of syncopal episodes.  He states he is otherwise feeling fine now.  He denies feeling dizzy or lightheaded.  He has no chest pain or difficulty breathing.  He has no leg swelling.  He does not have a headache or any nausea or vomiting.  He states that he did not feel particularly lightheaded, dizzy, or weak prior to passing out.  I reviewed the past medical records.  The patient was most recently seen by endocrinology on 8/29 for follow-up of an adrenal nodule.   Physical Exam   Triage Vital Signs: ED Triage Vitals [08/26/24 1106]  Encounter Vitals Group     BP 100/71     Girls Systolic BP Percentile      Girls Diastolic BP Percentile      Boys Systolic BP Percentile      Boys Diastolic BP Percentile      Pulse Rate 81     Resp 18     Temp 98 F (36.7 C)     Temp Source Oral     SpO2 98 %     Weight      Height      Head Circumference      Peak Flow      Pain Score 1     Pain Loc      Pain Education      Exclude from Growth Chart     Most recent vital signs: Vitals:   08/26/24 1106  BP: 100/71  Pulse: 81  Resp: 18  Temp: 98 F (36.7 C)  SpO2: 98%     General: Alert, comfortable appearing, no distress.  CV:  Good peripheral perfusion.  Resp:  Normal effort.  Lungs CTAB.  No Abd:  No distention.  Other:  EOMI.  PERRLA.  No  photophobia.  No facial droop.  Normal speech.  Motor intact in all extremities.  No ataxia.  Peripheral edema.   ED Results / Procedures / Treatments   Labs (all labs ordered are listed, but only abnormal results are displayed) Labs Reviewed  COMPREHENSIVE METABOLIC PANEL WITH GFR - Abnormal; Notable for the following components:      Result Value   Sodium 134 (*)    Glucose, Bld 373 (*)    AST 99 (*)    ALT 78 (*)    All other components within normal limits  CBC - Abnormal; Notable for the following components:   RBC 5.86 (*)    MCV 70.5 (*)    MCH 22.9 (*)    All other components within normal limits  URINALYSIS, ROUTINE W REFLEX MICROSCOPIC - Abnormal; Notable for the following components:   Color, Urine STRAW (*)    APPearance CLEAR (*)    Glucose, UA >=500 (*)  All other components within normal limits  CBG MONITORING, ED - Abnormal; Notable for the following components:   Glucose-Capillary 370 (*)    All other components within normal limits  CBG MONITORING, ED - Abnormal; Notable for the following components:   Glucose-Capillary 307 (*)    All other components within normal limits  RESP PANEL BY RT-PCR (RSV, FLU A&B, COVID)  [id number]  TROPONIN I (HIGH SENSITIVITY)  TROPONIN I (HIGH SENSITIVITY)     EKG  ED ECG REPORT I, Waylon Cassis, the attending physician, personally viewed and interpreted this ECG.  Date: 08/26/2024 EKG Time: 1106 Rate: 84 Rhythm: normal sinus rhythm QRS Axis: normal Intervals: normal ST/T Wave abnormalities: normal Narrative Interpretation: no evidence of acute ischemia    RADIOLOGY  Chest x-ray: I independently viewed and interpreted the images; there is no focal consolidation or edema  PROCEDURES:  Critical Care performed: No  Procedures   MEDICATIONS ORDERED IN ED: Medications  sodium chloride  0.9 % bolus 1,000 mL (0 mLs Intravenous Stopped 08/26/24 1516)  insulin  aspart (novoLOG ) injection 7 Units (7 Units  Subcutaneous Given 08/26/24 1429)     IMPRESSION / MDM / ASSESSMENT AND PLAN / ED COURSE  I reviewed the triage vital signs and the nursing notes.  58 year old male with PMH as noted above presents with a syncopal episode that occurred during an acute coughing fit but with no other prodrome.  He is asymptomatic currently although reports a cough for the last several weeks.  On exam his blood pressure is borderline low.  Other vital signs are normal.  Physical exam is otherwise unremarkable for acute findings.  Neuro exam is nonfocal.  Differential diagnosis includes, but is not limited to, vasovagal syncope, dehydration/hypovolemia, electrolyte abnormality, hyper or hypoglycemia, other metabolic cause, less likely cardiac etiology.  We will obtain a chest x-ray given the cough, lab workup including cardiac enzymes, give a fluid bolus, and reassess.  Patient's presentation is most consistent with acute complicated illness / injury requiring diagnostic workup.  The patient is on the cardiac monitor to evaluate for evidence of arrhythmia and/or significant heart rate changes.   ----------------------------------------- 2:23 PM on 08/26/2024 -----------------------------------------  Lab workup was significant for a blood glucose of 373.  CBC shows no acute findings.  Troponins are negative x 2.  Urinalysis is clear.  Blood glucose improved to 300 with fluids.  I have ordered a dose of insulin .  I did consider whether this patient may benefit from inpatient admission given the somewhat sudden syncopal episode with no clear vasovagal prodrome, although the fact that it was provoked by coughing is somewhat reassuring.  I did offer admission to the patient.  However, he declines.  He states he would strongly prefer to go home.  Given the negative workup I feel that this is reasonable.  I counseled the patient on the results of the workup and recommended that he follow-up with his primary care provider.   I gave strict return precautions, and he expressed understanding.   FINAL CLINICAL IMPRESSION(S) / ED DIAGNOSES   Final diagnoses:  Syncope, unspecified syncope type     Rx / DC Orders   ED Discharge Orders     None        Note:  This document was prepared using Dragon voice recognition software and may include unintentional dictation errors.    Cassis Waylon, MD 08/26/24 [phone]

## 2024-08-26 NOTE — ED Triage Notes (Addendum)
 Pt to ED via POV from home. Pt reports was walking outside and had unwitnessed syncopal episode. Pt has tear to left shirt and abrasion noted. EMS on scene and advised to bring pt in but pt refused transport. Pt denies N/V/D. Pt hx of type 2 DM. Pt reports recently started on insulin   EMS vitals  95% RA BP 90/50  CBG 436 HR 80

## 2024-09-04 ENCOUNTER — Other Ambulatory Visit: Payer: Self-pay | Admitting: Emergency Medicine

## 2024-09-04 DIAGNOSIS — Z87891 Personal history of nicotine dependence: Secondary | ICD-10-CM

## 2024-09-04 DIAGNOSIS — R0602 Shortness of breath: Secondary | ICD-10-CM

## 2024-09-04 DIAGNOSIS — R053 Chronic cough: Secondary | ICD-10-CM

## 2024-09-04 DIAGNOSIS — E0859 Diabetes mellitus due to underlying condition with other circulatory complications: Secondary | ICD-10-CM

## 2024-09-04 DIAGNOSIS — R911 Solitary pulmonary nodule: Secondary | ICD-10-CM

## 2024-09-05 ENCOUNTER — Ambulatory Visit
Admission: RE | Admit: 2024-09-05 | Discharge: 2024-09-05 | Disposition: A | Discharge status: Home / Self Care | Source: Ambulatory Visit | Attending: Emergency Medicine | Admitting: Emergency Medicine

## 2024-09-05 DIAGNOSIS — E0859 Diabetes mellitus due to underlying condition with other circulatory complications: Secondary | ICD-10-CM | POA: Insufficient documentation

## 2024-09-05 DIAGNOSIS — R0602 Shortness of breath: Secondary | ICD-10-CM | POA: Diagnosis present

## 2024-09-05 DIAGNOSIS — Z87891 Personal history of nicotine dependence: Secondary | ICD-10-CM | POA: Diagnosis present

## 2024-09-05 DIAGNOSIS — R911 Solitary pulmonary nodule: Secondary | ICD-10-CM | POA: Insufficient documentation

## 2024-09-05 DIAGNOSIS — R053 Chronic cough: Secondary | ICD-10-CM | POA: Diagnosis present

## 2024-09-25 ENCOUNTER — Ambulatory Visit

## 2024-09-25 DIAGNOSIS — Z23 Encounter for immunization: Secondary | ICD-10-CM
